# Patient Record
Sex: Male | Born: 1975
Health system: Southern US, Community
[De-identification: ages and names within clinical notes are randomized; demographics above are authoritative.]

## PROBLEM LIST (undated history)

## (undated) DIAGNOSIS — I10 Essential (primary) hypertension: Secondary | ICD-10-CM

## (undated) DIAGNOSIS — I428 Other cardiomyopathies: Secondary | ICD-10-CM

## (undated) DIAGNOSIS — I480 Paroxysmal atrial fibrillation: Secondary | ICD-10-CM

## (undated) HISTORY — DX: Other cardiomyopathies: I42.8

## (undated) HISTORY — PX: ANTERIOR CRUCIATE LIGAMENT REPAIR: SHX115

## (undated) HISTORY — DX: Paroxysmal atrial fibrillation: I48.0

## (undated) HISTORY — DX: Essential (primary) hypertension: I10

---

## 2000-01-11 ENCOUNTER — Encounter: Payer: Self-pay | Admitting: Orthopedic Surgery

## 2000-01-11 ENCOUNTER — Inpatient Hospital Stay (HOSPITAL_COMMUNITY): Admission: AD | Admit: 2000-01-11 | Discharge: 2000-01-13 | Payer: Self-pay | Admitting: Orthopedic Surgery

## 2003-02-26 ENCOUNTER — Encounter: Admission: RE | Admit: 2003-02-26 | Discharge: 2003-02-26 | Payer: Self-pay | Admitting: Sports Medicine

## 2003-03-20 ENCOUNTER — Ambulatory Visit (HOSPITAL_BASED_OUTPATIENT_CLINIC_OR_DEPARTMENT_OTHER): Admission: RE | Admit: 2003-03-20 | Discharge: 2003-03-20 | Payer: Self-pay | Admitting: Orthopedic Surgery

## 2003-06-02 ENCOUNTER — Emergency Department (HOSPITAL_COMMUNITY): Admission: EM | Admit: 2003-06-02 | Discharge: 2003-06-02 | Payer: Self-pay | Admitting: *Deleted

## 2006-10-03 ENCOUNTER — Inpatient Hospital Stay (HOSPITAL_COMMUNITY): Admission: EM | Admit: 2006-10-03 | Discharge: 2006-10-11 | Payer: Self-pay | Admitting: Emergency Medicine

## 2006-10-03 ENCOUNTER — Encounter: Payer: Self-pay | Admitting: Cardiology

## 2006-10-05 ENCOUNTER — Ambulatory Visit: Payer: Self-pay | Admitting: Cardiology

## 2006-10-05 HISTORY — PX: CARDIAC CATHETERIZATION: SHX172

## 2006-10-12 ENCOUNTER — Ambulatory Visit: Payer: Self-pay | Admitting: Cardiology

## 2006-10-19 ENCOUNTER — Ambulatory Visit: Payer: Self-pay | Admitting: Cardiology

## 2006-10-19 ENCOUNTER — Ambulatory Visit: Payer: Self-pay

## 2006-10-19 LAB — CONVERTED CEMR LAB
BUN: 10 mg/dL (ref 6–23)
Chloride: 106 meq/L (ref 96–112)
Creatinine, Ser: 0.8 mg/dL (ref 0.4–1.5)
GFR calc non Af Amer: 120 mL/min
Potassium: 4.6 meq/L (ref 3.5–5.1)

## 2006-10-26 ENCOUNTER — Ambulatory Visit: Payer: Self-pay | Admitting: Cardiology

## 2006-10-31 ENCOUNTER — Ambulatory Visit: Payer: Self-pay | Admitting: Cardiology

## 2006-11-07 ENCOUNTER — Ambulatory Visit: Payer: Self-pay | Admitting: Cardiology

## 2006-11-07 LAB — CONVERTED CEMR LAB
BUN: 11 mg/dL (ref 6–23)
CO2: 29 meq/L (ref 19–32)
Calcium: 8.9 mg/dL (ref 8.4–10.5)
GFR calc Af Amer: 169 mL/min
GFR calc non Af Amer: 140 mL/min

## 2006-11-21 ENCOUNTER — Ambulatory Visit: Payer: Self-pay | Admitting: Cardiology

## 2006-11-21 ENCOUNTER — Ambulatory Visit: Payer: Self-pay | Admitting: Internal Medicine

## 2006-12-19 ENCOUNTER — Ambulatory Visit: Payer: Self-pay | Admitting: Cardiology

## 2007-01-06 ENCOUNTER — Encounter: Payer: Self-pay | Admitting: Internal Medicine

## 2007-01-06 ENCOUNTER — Ambulatory Visit: Payer: Self-pay

## 2007-01-06 ENCOUNTER — Ambulatory Visit: Payer: Self-pay | Admitting: Internal Medicine

## 2007-01-27 ENCOUNTER — Ambulatory Visit: Payer: Self-pay | Admitting: Cardiology

## 2007-03-01 ENCOUNTER — Ambulatory Visit: Payer: Self-pay | Admitting: Cardiology

## 2007-04-04 ENCOUNTER — Ambulatory Visit: Payer: Self-pay | Admitting: Cardiology

## 2007-05-08 ENCOUNTER — Ambulatory Visit: Payer: Self-pay | Admitting: Internal Medicine

## 2007-08-30 ENCOUNTER — Ambulatory Visit: Payer: Self-pay | Admitting: Cardiovascular Disease

## 2007-09-13 ENCOUNTER — Ambulatory Visit: Payer: Self-pay | Admitting: Cardiovascular Disease

## 2007-09-28 ENCOUNTER — Ambulatory Visit: Payer: Self-pay | Admitting: Cardiology

## 2007-09-28 LAB — CONVERTED CEMR LAB
BUN: 8 mg/dL (ref 6–23)
Calcium: 8.9 mg/dL (ref 8.4–10.5)
Chloride: 101 meq/L (ref 96–112)
GFR calc Af Amer: 126 mL/min
GFR calc non Af Amer: 104 mL/min
Glucose, Bld: 92 mg/dL (ref 70–99)

## 2007-10-13 ENCOUNTER — Encounter: Admission: RE | Admit: 2007-10-13 | Discharge: 2007-10-13 | Payer: Self-pay | Admitting: Sports Medicine

## 2007-10-30 ENCOUNTER — Ambulatory Visit: Payer: Self-pay | Admitting: Cardiology

## 2007-10-30 ENCOUNTER — Encounter: Payer: Self-pay | Admitting: Cardiology

## 2007-10-30 ENCOUNTER — Ambulatory Visit: Payer: Self-pay

## 2007-11-20 ENCOUNTER — Encounter: Admission: RE | Admit: 2007-11-20 | Discharge: 2007-11-20 | Payer: Self-pay | Admitting: Orthopedic Surgery

## 2007-11-21 ENCOUNTER — Ambulatory Visit (HOSPITAL_BASED_OUTPATIENT_CLINIC_OR_DEPARTMENT_OTHER): Admission: RE | Admit: 2007-11-21 | Discharge: 2007-11-21 | Payer: Self-pay | Admitting: Orthopedic Surgery

## 2008-06-30 DIAGNOSIS — I10 Essential (primary) hypertension: Secondary | ICD-10-CM

## 2008-06-30 DIAGNOSIS — I480 Paroxysmal atrial fibrillation: Secondary | ICD-10-CM

## 2008-06-30 DIAGNOSIS — I428 Other cardiomyopathies: Secondary | ICD-10-CM

## 2010-10-16 ENCOUNTER — Encounter: Payer: Self-pay | Admitting: Physician Assistant

## 2010-10-16 ENCOUNTER — Ambulatory Visit (INDEPENDENT_AMBULATORY_CARE_PROVIDER_SITE_OTHER): Payer: 59 | Admitting: Physician Assistant

## 2010-10-16 DIAGNOSIS — I4891 Unspecified atrial fibrillation: Secondary | ICD-10-CM

## 2010-10-16 DIAGNOSIS — Z7289 Other problems related to lifestyle: Secondary | ICD-10-CM

## 2010-10-16 DIAGNOSIS — I428 Other cardiomyopathies: Secondary | ICD-10-CM

## 2010-10-16 DIAGNOSIS — I10 Essential (primary) hypertension: Secondary | ICD-10-CM

## 2010-10-16 LAB — CONVERTED CEMR LAB: TSH: 0.62 microintl units/mL

## 2010-10-16 NOTE — Assessment & Plan Note (Signed)
He needs further rate control.  I will adjust his Toprol to 100 mg in the morning and 50 mg in the evening.  He will return next week in close followup with me.  He can continue on Pradaxa for stroke prophylaxis.  If he remains in atrial fibrillation, we can consider pursuing cardioversion in about 4 weeks.  He had lab work drawn at his primary care provider's office today.  We will try to obtain these.  If he has not had TSH drawn, this will be arranged when he is seen back in followup next week.  He knows to contact us should he develop shortness of breath or weight gain.

## 2010-10-16 NOTE — Assessment & Plan Note (Signed)
Controlled.  

## 2010-10-16 NOTE — Assessment & Plan Note (Signed)
I have asked him to cut back on this.  I explained the risk of going back into atrial fibrillation with heavy alcohol use.  He seems to understand.

## 2010-10-16 NOTE — Progress Notes (Signed)
History of Present Illness: Primary Cardiologist:  Dr. Olga Millers  [name] is a 35 yo male with a h/o NICM with improved EF.  NICM thought to be 2/2 alcohol or tachy mediated.  He has a h/o AFib previously on coumadin.  Last echo in 2009 demonstrated EF 55-60%, mild to mod LAE, trivial MR.  Cath in 2008 demonstrated normal coronary arteries.  He has not been seen in our office since 2009.  He recently presented to his PCP with complaints of cold symptoms and a possible infection of his right leg.  It sounds as though he was treated for right lower extremity cellulitis.  He was noted to be back in atrial fibrillation.  Of note, the patient had not taken his metoprolol for a year or more.  It is unknown how fast his heart rate was.  At that time, his PCP placed him on Pradaxa 150 mg b.i.d.  He was also placed on Toprol-XL 100 mg daily.  He was asked to follow up today.  The patient denies any chest pain, shortness of breath, orthopnea, PND, edema.  He's been exercising and working without difficulty.  He denies syncope or near-syncope.  He denies any palpitations.  His right lower extremity pain and erythema has resolved.  Past Medical History  Diagnosis Date  . Nonischemic cardiomyopathy     EF improved; ? 2/2 ETOH vs. tachy mediated;  Echo 10/2007: EF 55-60%; mild to mod LAE,; trivial MR;    Cath 09/2006: normal cors  . Paroxysmal a-fib   . HTN (hypertension)     Current Medications: Current outpatient prescriptions:dabigatran (PRADAXA) 150 MG CAPS, Take 150 mg by mouth every 12 (twelve) hours.  , Disp: , Rfl: ;  metoprolol (TOPROL-XL) 100 MG 24 hr tablet, Take 100 mg by mouth daily.  , Disp: , Rfl:   No Known Allergies  History   Social History  . Marital Status: Married    Spouse Name: N/A    Number of Children: N/A  . Years of Education: N/A   Occupational History  . Not on file.   Social History Main Topics  . Smoking status: Never Smoker   . Smokeless tobacco: Not on file   . Alcohol Use: 7.2 oz/week    12 Cans of beer per week  . Drug Use: No  . Sexually Active:    Other Topics Concern  . Not on file   Social History Narrative  . No narrative on file   Review of Systems - See HPI.  Otherwise, all other systems reviewed and negative.  Vital Signs: BP 136/78  Pulse 123  Ht 6' (1.829 m)  Wt 280 lb 8 oz (127.234 kg)  BMI 38.04 kg/m2  PHYSICAL EXAM: Well nourished, well developed, in no acute distress HEENT: normal Neck: no JVD Endo: no thyromegaly. Cardiac:  normal S1, S2; irreg, irreg; no murmur Lungs:  clear to auscultation bilaterally, no wheezing, rhonchi or rales Abd: soft, nontender, no hepatomegaly Ext: no edema Skin: warm and dry Neuro:  CNs 2-12 intact, no focal abnormalities noted Vasc: no carotid bruits.  EKG: Atrial Fibrillation; normal axis; nonspecific ST-T wave changes; nonspecific interventricular conduction delay.  Heart rate 123.

## 2010-10-16 NOTE — Assessment & Plan Note (Signed)
His EF has normalized in the past.  We will arrange followup echocardiogram to reassess his LV function given his recurrent atrial fibrillation.

## 2010-10-20 NOTE — Assessment & Plan Note (Addendum)
Summary: Please see noted done in [facility] that is scanned in.  Medications Added PRADAXA 150 MG CAPS (DABIGATRAN ETEXILATE MESYLATE) Take 1 tablet by mouth twice a day TOPROL XL 100 MG XR24H-TAB (METOPROLOL SUCCINATE) Take one by mouth every morning; Take one-half tab. by mouth every evening      Allergies Added: NKDA  Visit Type:  Follow-up Primary Provider:  Dr. 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 I educated pt and explained that he does need to have his INR checked still on Pradaxa just not as often as when on coumadin.  History of Present Illness:  Please see noted done in [facility] that is scanned in.  Current Medications (verified): 1)  Pradaxa 150 Mg Caps (Dabigatran Etexilate Mesylate) .... Take 1 Tablet By Mouth Twice A Day 2)  Toprol Xl 100 Mg Xr24h-Tab (Metoprolol Succinate) .... Take 1 Tablet By Mouth Once A Day  Allergies (verified): No Known Drug Allergies  Vital Signs:  Patient profile:   35 year old male Height:      72 inches Weight:      280.50 pounds BMI:     38.18 Pulse rate:   123 / minute Pulse rhythm:   irregular Resp:     16 per minute BP sitting:   136 / 78  (left arm) Cuff size:   regular  Vitals Entered By: Celestia Khat, CMA (October 16, 2010 11:11 AM)   Impression & Recommendations:  Problem # 1:  ATRIAL FIBRILLATION (ICD-427.31)  His updated medication list for this problem includes:    Toprol Xl 100 Mg Xr24h-tab (Metoprolol succinate) .[name]... Take one by mouth every morning; take one-half tab. by mouth every evening  Orders: EKG w/ Interpretation (93000)  Patient Instructions: 1)  Your physician has recommended you make the following change in your medication: Increase Toprol to 100 mg take 1 tab by mouth in  the morning and 1/2 tab by mouth in the evening.  A [facility] [address] 2)  Your physician recommends that you schedule a follow-up appointment in: 10/23/10 10:00 TO SEE SCOTT WEAVER, PA Prescriptions: TOPROL XL 100 MG XR24H-TAB (METOPROLOL SUCCINATE) Take one by mouth every morning; Take one-half tab. by mouth every evening  #45 x 11   Entered and Authorized by:   Tereso Newcomer PA-C   Signed by:   Tereso Newcomer PA-C on 10/16/2010   Method used:   [facility]  [address] [id number]       [address]       [place], [place]  [address]       Ph: [phone] or [phone]       Fax: [phone]   RxID:   [id number]   Appended Document: Please see noted done in [facility] that is scanned in. Tried to call pt and set up day for echo to be done this week. 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 Danielle Rankin, CMA  Appended Document: Please see noted done in [facility]  that is scanned in. Tried to call pt again to schedule echo and after about 30 rings and voice message came on and said to put in remote access and then the phone disconnected. Cannot leave message @ work # either for pt. Danielle Rankin, CMA.Shane KitchenMarland KitchenMarland Frederick

## 2010-10-21 ENCOUNTER — Encounter: Payer: Self-pay | Admitting: Physician Assistant

## 2010-10-23 ENCOUNTER — Encounter: Payer: Self-pay | Admitting: Physician Assistant

## 2010-10-23 ENCOUNTER — Ambulatory Visit (INDEPENDENT_AMBULATORY_CARE_PROVIDER_SITE_OTHER): Payer: 59 | Admitting: Physician Assistant

## 2010-10-23 DIAGNOSIS — I4891 Unspecified atrial fibrillation: Secondary | ICD-10-CM

## 2010-10-23 NOTE — Progress Notes (Signed)
History of Present Illness: Primary Cardiologist: Dr. Olga Millers  [name] is a 35 yo male with a h/o AFib and NICM with improved EF. NICM thought to be 2/2 alcohol or tachy mediated. He has a h/o AFib previously on coumadin. Last echo in 2009 demonstrated EF 55-60%, mild to mod LAE, trivial MR. Cath in 2008 demonstrated normal coronary arteries.   I saw him last week with recurrent atrial fibrillation.  He was placed on Pradaxa by his primary care provider.  Hel was off of his Toprol.  This was restarted.  I adjusted his medication for rate control and brought him back today for followup.  He still needs an echocardiogram scheduled.  Of note, his TSH was normal.  He is doing well.  He has no complaints.  Denies palpitations, chest pain, shortness of breath.  He denies orthopnea, PND or edema.  He denies syncope.  He denies lightheadedness or dizziness or near-syncope.  He is tolerating the medications well.   Past Medical History  Diagnosis Date  . Nonischemic cardiomyopathy     EF improved; ? 2/2 ETOH vs. tachy mediated;  Echo 10/2007: EF 55-60%; mild to mod LAE,; trivial MR;    Cath 09/2006: normal cors  . Paroxysmal a-fib   . HTN (hypertension)     Current Medications: Current outpatient prescriptions:dabigatran (PRADAXA) 150 MG CAPS, Take 150 mg by mouth every 12 (twelve) hours.  , Disp: , Rfl: ;  metoprolol (TOPROL-XL) 100 MG 24 hr tablet, Take 100 mg by mouth. Take 1 tab in the morning and 1/2 tab in the evening, Disp: , Rfl:   No Known Allergies  Vital Signs: BP 118/62  Pulse 70  Ht 6' (1.829 m)  Wt 284 lb (128.822 kg)  BMI 38.52 kg/m2  PHYSICAL EXAM: Well nourished, well developed, in no acute distress HEENT: normal Neck: no JVD Cardiac:  normal S1, S2; Irregularly irregular; no murmur Lungs:  clear to auscultation bilaterally, no wheezing, rhonchi or rales Abd: soft, nontender, no hepatomegaly Ext: no edema Skin: warm and dry Neuro:  CNs 2-12 intact, no focal  abnormalities noted  EKG: Atrial fibrillation, heart rate 96, normal axis, no acute changes

## 2010-10-23 NOTE — Assessment & Plan Note (Signed)
Rate is much better controlled.  He will continue on Pradaxa.  I will increase his Toprol to 100 mg twice a day.  He will be set up for an echocardiogram to reassess his LV function.  I will bring him back in the next couple weeks with Dr. Jens Som to discuss possible cardioversion.  He knows to contact us if he has a change in symptoms before then.

## 2010-10-29 NOTE — Miscellaneous (Signed)
Summary: TSH normal in 09/2010  Clinical Lists Changes  Observations: Added new observation of TSH: 0.62 microintl units/mL (10/16/2010 9:00)

## 2010-10-29 NOTE — Assessment & Plan Note (Signed)
Summary: Please see noted done in [facility] Link that will be scanned i  Medications Added TOPROL XL 100 MG XR24H-TAB (METOPROLOL SUCCINATE) Take 1 tablet by mouth two times a day      Allergies Added: NKDA  Visit Type:  Follow-up Primary Provider:  Dr. Kevan Ny  CC:  no complaints.  History of Present Illness: Please see noted done in [facility] that will be scanned in.   Current Medications (verified): 1)  Pradaxa 150 Mg Caps (Dabigatran Etexilate Mesylate) .... Take 1 Tablet By Mouth Twice A Day 2)  Toprol Xl 100 Mg Xr24h-Tab (Metoprolol Succinate) .... Take One By Mouth Every Morning; Take One-Half Tab. By Mouth Every Evening  Allergies (verified): No Known Drug Allergies  Vital Signs:  Patient profile:   35 year old male Height:      72 inches Weight:      284 pounds BMI:     38.66 Pulse rate:   70 / minute BP sitting:   118 / 82  (left arm) Cuff size:   regular  Vitals Entered By: Hardin Negus, RMA (October 23, 2010 10:19 AM)   Other Orders: EKG w/ Interpretation (93000) Echocardiogram (Echo)  Patient Instructions: 1)  Your physician has recommended you make the following change in your medication:  2)  1.  Increase Toprol to 100 mg two times a day. 3)  2.  Keep taking Pradaxa.  Do not miss any. 4)  Your physician has requested that you have an echocardiogram.  Echocardiography is a painless test that uses sound waves to create images of your heart. It provides your doctor with information about the size and shape of your heart and how well your heart's chambers and valves are working.  This procedure takes approximately one hour. There are no restrictions for this procedure. 5)  Your physician recommends that you schedule a follow-up appointment in:  2 weeks with Dr. Jens Som or Lorin Picket on a day Dr. Jens Som is here. Prescriptions: TOPROL XL 100 MG XR24H-TAB (METOPROLOL SUCCINATE) Take 1 tablet by mouth two times a day  #60 x 5   Entered and Authorized by:    Tereso Newcomer PA-C   Signed by:   Tereso Newcomer PA-C on 10/23/2010   Method used:   [facility]  [address] [id number]       [address]       [place], [place]  [address]       Ph: [phone] or [phone]       Fax: [phone]   RxID:   [id number]

## 2010-11-03 NOTE — Letter (Signed)
Summary: [facility] Progress Note  [facility] Progress Note   Imported By: Earl Many 10/30/2010 16:50:35  _____________________________________________________________________  External Attachment:    Type:   Image     Comment:   External Document

## 2010-11-06 ENCOUNTER — Ambulatory Visit (INDEPENDENT_AMBULATORY_CARE_PROVIDER_SITE_OTHER): Payer: 59 | Admitting: Physician Assistant

## 2010-11-06 ENCOUNTER — Encounter (INDEPENDENT_AMBULATORY_CARE_PROVIDER_SITE_OTHER): Payer: Self-pay | Admitting: *Deleted

## 2010-11-06 ENCOUNTER — Ambulatory Visit (HOSPITAL_COMMUNITY): Payer: 59 | Attending: Cardiology

## 2010-11-06 ENCOUNTER — Other Ambulatory Visit: Payer: Self-pay | Admitting: Cardiology

## 2010-11-06 ENCOUNTER — Other Ambulatory Visit: Payer: Self-pay | Admitting: Physician Assistant

## 2010-11-06 ENCOUNTER — Encounter: Payer: Self-pay | Admitting: Physician Assistant

## 2010-11-06 DIAGNOSIS — I059 Rheumatic mitral valve disease, unspecified: Secondary | ICD-10-CM | POA: Insufficient documentation

## 2010-11-06 DIAGNOSIS — I10 Essential (primary) hypertension: Secondary | ICD-10-CM | POA: Insufficient documentation

## 2010-11-06 DIAGNOSIS — E669 Obesity, unspecified: Secondary | ICD-10-CM | POA: Insufficient documentation

## 2010-11-06 DIAGNOSIS — R609 Edema, unspecified: Secondary | ICD-10-CM

## 2010-11-06 DIAGNOSIS — I4891 Unspecified atrial fibrillation: Secondary | ICD-10-CM

## 2010-11-06 DIAGNOSIS — I428 Other cardiomyopathies: Secondary | ICD-10-CM

## 2010-11-06 DIAGNOSIS — Z6838 Body mass index (BMI) 38.0-38.9, adult: Secondary | ICD-10-CM | POA: Insufficient documentation

## 2010-11-06 LAB — CBC WITH DIFFERENTIAL/PLATELET
Basophils Absolute: 0 10*3/uL (ref 0.0–0.1)
Eosinophils Absolute: 0.3 10*3/uL (ref 0.0–0.7)
Eosinophils Relative: 3.1 % (ref 0.0–5.0)
MCV: 90.8 fl (ref 78.0–100.0)
Monocytes Absolute: 0.6 10*3/uL (ref 0.1–1.0)
Neutrophils Relative %: 69 % (ref 43.0–77.0)
Platelets: 144 10*3/uL — ABNORMAL LOW (ref 150.0–400.0)
RDW: 12.9 % (ref 11.5–14.6)
WBC: 9.4 10*3/uL (ref 4.5–10.5)

## 2010-11-06 LAB — PROTIME-INR
INR: 1.1 ratio — ABNORMAL HIGH (ref 0.8–1.0)
Prothrombin Time: 12.2 s (ref 10.2–12.4)

## 2010-11-06 LAB — BASIC METABOLIC PANEL
CO2: 26 mEq/L (ref 19–32)
Calcium: 8.4 mg/dL (ref 8.4–10.5)
Creatinine, Ser: 0.9 mg/dL (ref 0.4–1.5)

## 2010-11-06 NOTE — Assessment & Plan Note (Signed)
He has some edema and his JVP is elevated.  His echocardiogram is pending from today.  I will give him Lasix 20 mg a day for 3 days.  I will also give him potassium 10 mEq once a day for 3 days.  He can take this p.r.n. After this.  I will check a basic metabolic panel and a BNP today.  We'll also obtain a repeat basic metabolic panel prior to his cardioversion.

## 2010-11-06 NOTE — Assessment & Plan Note (Signed)
Echocardiogram pending to reassess his LV function while back in atrial fibrillation.

## 2010-11-06 NOTE — Progress Notes (Signed)
History of Present Illness: Primary Cardiologist: Dr. Olga Millers   [name] is a 35 yo male with a h/o AFib and NICM with improved EF. NICM thought to be 2/2 alcohol or tachy mediated. He has a h/o AFib previously on coumadin. Last echo in 2009 demonstrated EF 55-60%, mild to mod LAE, trivial MR. Cath in 2008 demonstrated normal coronary arteries.   I saw him on February 24th with recurrent atrial fibrillation.  He had been placed back on his Toprol.  I have been adjusting his Toprol for rate control.  He has been on Pradaxa.  He had an echocardiogram today.  He returns for followup.  He felt swollen earlier this week and took one Lasix with improvement.  Otherwise, he denies shortness of breath, chest pain, syncope.  He denies orthopnea or PND.  He has been diligent about taking his Pradaxa twice a day every day.  He has been on this medication for 4 weeks now.   Past Medical History  Diagnosis Date  . Nonischemic cardiomyopathy     EF improved; ? 2/2 ETOH vs. tachy mediated;  Echo 10/2007: EF 55-60%; mild to mod LAE,; trivial MR;    Cath 09/2006: normal cors  . Paroxysmal a-fib   . HTN (hypertension)     Current Outpatient Prescriptions  Medication Sig Dispense Refill  . dabigatran (PRADAXA) 150 MG CAPS Take 150 mg by mouth every 12 (twelve) hours.        . metoprolol (TOPROL-XL) 100 MG 24 hr tablet Take 100 mg by mouth 2 (two) times daily.         No Known Allergies  Vital Signs: BP 126/80  Pulse 98  Ht 6' (1.829 m)  Wt 284 lb (128.822 kg)  BMI 38.52 kg/m2  PHYSICAL EXAM: Well nourished, well developed, in no acute distress HEENT: normal Neck: Mildly elevated JVP at 45 Cardiac:  normal S1, S2;  Irregularly irregular rhythm Lungs:  clear to auscultation bilaterally, no wheezing, rhonchi or rales Abd: soft, nontender, no hepatomegaly Ext: Trace bilateral edema Skin: warm and dry Neuro:  CNs 2-12 intact, no focal abnormalities noted  EKG: Atrial fibrillation, heart  rate 98, normal axis, no ischemic changes, PVC

## 2010-11-06 NOTE — Assessment & Plan Note (Signed)
Rate controlled.  He has been on Pradaxa twice a day for 4 weeks total.  I discussed with Dr. Jens Som.  We will plan on elective cardioversion next week.

## 2010-11-09 ENCOUNTER — Telehealth (INDEPENDENT_AMBULATORY_CARE_PROVIDER_SITE_OTHER): Payer: Self-pay | Admitting: *Deleted

## 2010-11-10 ENCOUNTER — Telehealth: Payer: Self-pay | Admitting: Physician Assistant

## 2010-11-10 NOTE — Telephone Encounter (Signed)
Called pt on cell phone and explained for him not to take his metoprolol the morning of his DCCV, but to make sure he does still take his Pradaxa that this was important not to miss the dose of Pradaxa. Pt gave verbal understanding today. Danielle Rankin, CMA

## 2010-11-10 NOTE — Telephone Encounter (Signed)
Tried to call pt back today, but no answer and no machine to leave message on...states this is his 3rd time calling but I do not see other messages.Marland KitchenMarland KitchenArletha Pili, CMA
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
## 2010-11-10 NOTE — Letter (Signed)
Summary: Cardioversion/TEE Instructions  Architectural technologist, Main Office  [address]   [place], [place] [address]   Phone: [phone]  Fax: [phone]    Cardioversion / TEE Cardioversion Instructions  11/06/2010 MRN: [id number]  [name] [address] [place], [place]  [address]  [place]  Dear Mr. HILLIGOSS, You are scheduled for a Cardioversion on _3/22/12 @ 10 AM with Dr. Jens Som.  Please arrive at the [facility] at  8:30 a.m.  on the day of your procedure.  1)   DIET:  A)   Nothing to eat or drink after midnight except your medications with a sip of water.  B)   May have clear liquid breakfast, then nothing to eat or drink after 6 a.m.      Clear liquids include:  water, broth, Sprite, Ginger Ale, black coffee, tea (no sugar),      cranberry / grape / apple juice, jello (not red), popsicle from clear juices (not red).  2)   Come to the [place] office on _3/21/12 for lab work. The lab at [facility] is open from 8:30 a.m. to 1:30 p.m. and 2:30 p.m. to 4:00 p.m. The lab at [facility] is open from 7:30 a.m. to 5:30 p.m. You do not have to be fasting.  3)   MAKE SURE YOU TAKE YOUR PRADAXA.   B)   YOU MAY TAKE ALL of your remaining medications with a small amount of water.    4)  Must have a responsible person to drive you home.  5)   Bring a current list of your medications and current insurance cards.   * Special Note:  Every effort is made to have your procedure done on time. Occasionally there are emergencies that present themselves at the hospital that may cause delays. Please be patient if a delay does occur.  * If you have any questions after you get home, please call the office at [phone].

## 2010-11-11 ENCOUNTER — Ambulatory Visit (INDEPENDENT_AMBULATORY_CARE_PROVIDER_SITE_OTHER): Payer: 59 | Admitting: *Deleted

## 2010-11-11 DIAGNOSIS — I4891 Unspecified atrial fibrillation: Secondary | ICD-10-CM

## 2010-11-11 LAB — BASIC METABOLIC PANEL
BUN: 10 mg/dL (ref 6–23)
CO2: 28 mEq/L (ref 19–32)
Calcium: 9.1 mg/dL (ref 8.4–10.5)
Creatinine, Ser: 0.9 mg/dL (ref 0.4–1.5)
Glucose, Bld: 102 mg/dL — ABNORMAL HIGH (ref 70–99)

## 2010-11-11 LAB — CBC WITH DIFFERENTIAL/PLATELET
Eosinophils Absolute: 0 10*3/uL (ref 0.0–0.7)
Lymphs Abs: 1.5 10*3/uL (ref 0.7–4.0)
MCHC: 34.2 g/dL (ref 30.0–36.0)
MCV: 91.5 fl (ref 78.0–100.0)
Monocytes Absolute: 0.7 10*3/uL (ref 0.1–1.0)
Neutrophils Relative %: 82.2 % — ABNORMAL HIGH (ref 43.0–77.0)
Platelets: 204 10*3/uL (ref 150.0–400.0)
RDW: 13 % (ref 11.5–14.6)

## 2010-11-12 ENCOUNTER — Ambulatory Visit (HOSPITAL_COMMUNITY)
Admission: RE | Admit: 2010-11-12 | Discharge: 2010-11-12 | Disposition: A | Discharge status: Home / Self Care | Payer: 59 | Source: Ambulatory Visit | Attending: Cardiology | Admitting: Cardiology

## 2010-11-12 DIAGNOSIS — I4891 Unspecified atrial fibrillation: Secondary | ICD-10-CM

## 2010-11-12 DIAGNOSIS — Z01810 Encounter for preprocedural cardiovascular examination: Secondary | ICD-10-CM | POA: Insufficient documentation

## 2010-11-13 ENCOUNTER — Telehealth: Payer: Self-pay | Admitting: *Deleted

## 2010-11-13 NOTE — Telephone Encounter (Signed)
Pt rtn call-pls call  °

## 2010-11-13 NOTE — Telephone Encounter (Signed)
Message copied by Deliah Goody on Fri Nov 13, 2010  9:07 AM ------      Message from: Olga Millers      Created: Wed Nov 11, 2010  5:26 PM       Repeat cbc in 4 weeks

## 2010-11-13 NOTE — Telephone Encounter (Signed)
Spoke with pt, appt for follow up scheduled and labs will be done at that appt

## 2010-11-19 NOTE — Progress Notes (Signed)
   Phone Note Outgoing Call   Summary of Call: lmom on pt cell [phone] for ptcb, pt needs to hold toprol xl the morning of his DCCV 11/12/10 @ 10am...Marland KitchenMarland KitchenDanielle Rankin, CMA

## 2010-11-19 NOTE — Progress Notes (Signed)
Summary: pt rtn call   Phone Note Call from Patient   Caller: Patient [phone] Reason for Call: Talk to Nurse Summary of Call: pt rtn call Initial call taken by: Glynda Jaeger,  November 10, 2010 8:03 AM  Follow-up for Phone Call        pt aware of needing to hold metoprolol but still take his Pradaxa not to missed any doses of Pradaxa , gave verbal understanding. Danielle Rankin, CMA  November 10, 2010 1:15 PM

## 2010-12-14 ENCOUNTER — Encounter: Payer: Self-pay | Admitting: Cardiology

## 2010-12-14 ENCOUNTER — Encounter: Payer: Self-pay | Admitting: *Deleted

## 2010-12-15 ENCOUNTER — Ambulatory Visit (INDEPENDENT_AMBULATORY_CARE_PROVIDER_SITE_OTHER): Payer: 59 | Admitting: Cardiology

## 2010-12-15 ENCOUNTER — Encounter: Payer: Self-pay | Admitting: Cardiology

## 2010-12-15 DIAGNOSIS — I428 Other cardiomyopathies: Secondary | ICD-10-CM

## 2010-12-15 DIAGNOSIS — Z7289 Other problems related to lifestyle: Secondary | ICD-10-CM

## 2010-12-15 DIAGNOSIS — I10 Essential (primary) hypertension: Secondary | ICD-10-CM

## 2010-12-15 MED ORDER — LISINOPRIL 10 MG PO TABS: 10.0000 mg | ORAL_TABLET | Freq: Every day | ORAL | Status: DC

## 2010-12-15 NOTE — Patient Instructions (Signed)
Your physician recommends that you schedule a follow-up appointment in: 3 months with Dr. Jens Som  Your physician has requested that you have an echocardiogram. Echocardiography is a painless test that uses sound waves to create images of your heart. It provides your doctor with information about the size and shape of your heart and how well your heart's chambers and valves are working. This procedure takes approximately one hour. There are no restrictions for this procedure. In 3 months.  Your physician recommends that you return for lab work in 1 week.   Your physician has recommended you make the following change in your medication: START LISINOPRIL 10mg  daily.

## 2010-12-15 NOTE — Assessment & Plan Note (Signed)
Patient remains in sinus rhythm status post cardioversion. Continue pradaxa and toprol.

## 2010-12-15 NOTE — Progress Notes (Signed)
[name] is a 35 yo male with a h/o AFib and NICM with improved EF. NICM thought to be due to alcohol or tachy mediated. He has a h/o AFib previously on coumadin. Last echo in 2009 demonstrated EF 55-60%, mild to mod LAE, trivial MR. Cath in 2008 demonstrated normal coronary arteries. Patient seen again on February 24th with recurrent atrial fibrillation. He had been placed back on his Toprol and on Pradaxa. He had an echocardiogram in March of 2012  that showed severely reduced LV function, biatrial enlargement and reduced RV function. There was mild mitral regurgitation. He recently underwent cardioversion of his atrial fibrillation. Since then, the patient denies any dyspnea on exertion, orthopnea, PND, pedal edema, palpitations, syncope or chest pain.    Current Outpatient Prescriptions  Medication Sig Dispense Refill  . dabigatran (PRADAXA) 150 MG CAPS Take 150 mg by mouth every 12 (twelve) hours.        . furosemide (LASIX) 20 MG tablet 1 TAB once daily FOR 3 DAYS THEN TAKE  as needed.       . metoprolol (TOPROL-XL) 100 MG 24 hr tablet Take 100 mg by mouth 2 (two) times daily.       . potassium chloride (KLOR-CON) 10 MEQ CR tablet 1 TAB once daily FOR 3 DAYS THEN TAKE  as needed.          Past Medical History  Diagnosis Date  . Nonischemic cardiomyopathy     EF improved; ? 2/2 ETOH vs. tachy mediated;  Echo 10/2007: EF 55-60%; mild to mod LAE,; trivial MR;    Cath 09/2006: normal cors  . Paroxysmal a-fib   . HTN (hypertension)     Past Surgical History  Procedure Date  . Anterior cruciate ligament repair   . Cardiac catheterization 10/05/06    History   Social History  . Marital Status: Married    Spouse Name: N/A    Number of Children: N/A  . Years of Education: N/A   Occupational History  . Not on file.   Social History Main Topics  . Smoking status: Former Games developer  . Smokeless tobacco: Not on file   Comment: quit appox 12 yrs ago  . Alcohol Use: 7.2 oz/week   12 Cans of beer per week  . Drug Use: No  . Sexually Active: Not on file   Other Topics Concern  . Not on file   Social History Narrative  . No narrative on file    ROS: no fevers or chills, productive cough, hemoptysis, dysphasia, odynophagia, melena, hematochezia, dysuria, hematuria, rash, seizure activity, orthopnea, PND, pedal edema, claudication. Remaining systems are negative.  Physical Exam: Well-developed well-nourished in no acute distress.  Skin is warm and dry.  HEENT is normal.  Neck is supple. No thyromegaly.  Chest is clear to auscultation with normal expansion.  Cardiovascular exam is regular rate and rhythm.  Abdominal exam nontender or distended. No masses palpated. Extremities show no edema. neuro grossly intact  ECG sinus rhythm at a rate of 63. No ST changes.

## 2010-12-15 NOTE — Assessment & Plan Note (Signed)
Blood pressure elevated. Add lisinopril 10 mg daily. 

## 2010-12-15 NOTE — Assessment & Plan Note (Signed)
Patient counseled on discontinuing. 

## 2010-12-15 NOTE — Assessment & Plan Note (Signed)
Ejection fraction again decreased. This is either related to EtOH or atrial fibrillation with a rapid ventricular response. Continue beta blocker. Add lisinopril 10 mg p.o. Daily. Check potassium and renal function in one week. Plan repeat echocardiogram in 3 months to see if LV function has improved status post cardioversion and avoiding EtOH.

## 2010-12-25 ENCOUNTER — Other Ambulatory Visit (INDEPENDENT_AMBULATORY_CARE_PROVIDER_SITE_OTHER): Payer: 59 | Admitting: *Deleted

## 2010-12-25 DIAGNOSIS — I428 Other cardiomyopathies: Secondary | ICD-10-CM

## 2010-12-25 LAB — BASIC METABOLIC PANEL
BUN: 12 mg/dL (ref 6–23)
Calcium: 8.6 mg/dL (ref 8.4–10.5)
Creatinine, Ser: 0.8 mg/dL (ref 0.4–1.5)
GFR: 121.81 mL/min (ref >60.00)

## 2011-01-05 NOTE — Op Note (Signed)
NAMEORACIO, GALEN                ACCOUNT NO.:  [id number]   MEDICAL RECORD NO.:  [id number]          PATIENT TYPE:  AMB   LOCATION:  DSC                          FACILITY:  [facility]   PHYSICIAN:  Leonides Grills, M.D.     DATE OF BIRTH:  04-16-1976   DATE OF PROCEDURE:  11/21/2007  DATE OF DISCHARGE:                               OPERATIVE REPORT   PREOPERATIVE DIAGNOSES:  1. Right anterior distal tibial spurs.  2. Right anterior ankle loose body.  3. Right medial talar dome osteochondral lesion.  4. Right anterior ankle impingement.   POSTOPERATIVE DIAGNOSES:  1. Right anterior distal tibial spurs.  2. Right anterior ankle loose body.  3. Right medial talar dome osteochondral lesion.  4. Right anterior ankle impingement.   OPERATIONS:  1. Right ankle arthroscopy with limited debridement.  2. Right ankle arthrotomy with excision anterior distal tibial spurs.  3. Right ankle arthrotomy with excision anterior ankle loose body.  4. Debridement and drilling right medial talar dome osteochondral      lesion.   ANESTHESIA:  General with popliteal block.   SURGEON:  Leonides Grills, M.D.   ASSISTANT:  Rexene Edison, PA-C   ESTIMATED BLOOD LOSS:  Minimal.   TOURNIQUET TIME:  Approximately half hour.   COMPLICATIONS:  None.   DISPOSITION:  Stable to PAR.   INDICATIONS:  This is a 35 year old male who has had longstanding  anterior ankle pain that was interfering with his life.  He was  consented for the above procedure.  All risks which included infection,  nerve or vessel injury, persistent pain, worsening pain, prolonged  recovery, stiffness, arthritis were all explained, questions were  encouraged and answered.   The patient was taken to the operating room and placed in supine  position.  After adequate general endotracheal anesthesia was  administered as well as Ancef 1 g IV piggyback, the patient was then  placed in a sloppy lateral position with the operative side up on a  beanbag.  All bony prominence were well padded.  The right lower  extremity was then prepped and draped in a sterile manner over a  proximally placed thigh tourniquet.  We started the procedure mapping  out the anatomical landmarks to include anterior tibialis tendon,  peroneus tertius tendon.  The superficial peroneal nerve could not be  seen.  Spinal needle was then placed into the ankle and 20 mL of normal  saline was instilled in the ankle.  Weston Brass and spread technique was then  used to create the anteromedial portal.  Blunt tip trocar with cannula  followed by camera was placed in the ankle.  We tried to attempt to do  under direct visualization but due to the large osteophyte that was in  the way we were unable to visualize anterolaterally initially.  We then  just lateral to the peroneus tertius tendon made an anterolateral portal  with the nick and spread technique.  We then found the loose body was  quite large and there was a large amount of synovitis around it.  We  then extended the incision  and performed an ankle arthrotomy  anterolaterally.  Once this was done, we then removed the loose body  with a grabber.  This had to be actually be snapped due to the large  piece. Once this was removed we were then able to do a debridement of  the entire anterior aspect of the ankle which had a large amount of  synovitis.  We also cleaned around the anterior tibial osteophyte but  again we were going to remove this open.  We then saw that he had medial  talar dome osteochondral lesion that was anteriorly.  This was debrided  with a shaver as well as a House curette down to bone and normal  cartilage.  Multiple drill holes were placed using a microfracture  technique.  There was also a loose body in the deep deltoid ligament  region in the sub medial malleolar area.  This was also removed as well.  Once this was done, pictures were obtained throughout the procedure.  We  then removed the  scope and then made an ankle arthrotomy both  anteromedially and anterolaterally.  We then palpated out with a Glorious Peach  the entire anterior aspect of the distal tibia.  Then with a curved 1/4  inch osteotome this was then removed.  Once this was done, this was  palpated completely across the tibia to be adequately decompressed and  with dorsiflexion there was no impingement anteriorly.  The area was  copiously irrigated with normal saline.  There were no palpable loose  bodies in this area.  Again, we had the tourniquet up briefly during the  procedure, but due to the venous tourniquet we had to discontinue this.  The wound was closed with 4-0 nylon stitch.  Sterile dressing was  applied.  A modified Jones dressing was applied.  The patient was stable  to PAR.      Leonides Grills, M.D.  Electronically Signed     PB/MEDQ  D:  11/21/2007  T:  11/21/2007  Job:  [id number]

## 2011-01-05 NOTE — Assessment & Plan Note (Signed)
[facility]                            CARDIOLOGY OFFICE NOTE   MELBERT, BOTELHO                       MRN:          [id number]  DATE:03/01/2007                            DOB:          10-Mar-1976    Mr. Barrick is a pleasant gentleman who has a history of nonischemic  cardiomyopathy.  We did repeat his echocardiogram recently on Jan 06, 2007.  His LV function was normal with an estimated ejection fraction of  55% to 60%.  There was mild mitral regurgitation, tricuspid  regurgitation, and pulmonic insufficiency.  Since I last saw him, there  is no exertional chest pain, dyspnea, pedal edema, palpitations, or  syncope.   His medications include:  1. Coumadin as directed.  2. Digoxin 0.25 mg p.o. daily.  3. Toprol 100 mg p.o. daily.  4. Altace 5 mg p.o. b.i.d.   PHYSICAL EXAMINATION:  Shows a blood pressure of 143/87, and his pulse  is 61.  He weighs 273 pounds.  NECK:  Supple.  CHEST:  Clear.  CARDIOVASCULAR EXAM:  Reveals a regular rate and rhythm.  ABDOMINAL EXAM:  Benign.  EXTREMITIES:  Show no edema.  ELECTROCARDIOGRAM:  Shows a sinus rhythm at a rate of 59.  There are no  significant ST changes.   DIAGNOSES:  1. Nonischemic cardiomyopathy:  His left ventricular is now improved      on his recent echocardiogram.  His previous cardiomyopathy was most      likely either related to alcohol abuse or atrial fibrillation with      a rapid ventricular response (tachycardia mediated cardiomyopathy).      We will continue with his digoxin, Toprol, Altace, and Coumadin.  2. Paroxysmal atrial fibrillation:  He remains in sinus rhythm.  He      will continue on his digoxin and Toprol for rate control if his      atrial fibrillation recurs, as well as Coumadin with a goal INR of      2 to 3.  This is being monitored in our Coumadin Clinic.  3. Coumadin therapy:  As per number 2.  4. Alcohol abuse:  Now is off.  5. Hypertension:  His blood pressure  is borderline.  We will track      this and adjust as indicated.   We will see him back in 6 months.     Madolyn Frieze Jens Som, MD, [facility]  Electronically Signed   BSC/MedQ  DD: 03/01/2007  DT: 03/01/2007  Job #: [id number]

## 2011-01-05 NOTE — Assessment & Plan Note (Signed)
[facility]                            CARDIOLOGY OFFICE NOTE   AMORE, GRATER                       MRN:          [id number]  DATE:09/28/2007                            DOB:          09/24/1975    HISTORY OF PRESENT ILLNESS:  Mr. Villalva a very pleasant gentleman who has  a history of nonischemic cardiomyopathy.  A follow-up echocardiogram.  However, following cardioversion revealed that his LV function had  improved.  Most recent echo was on Jan 06, 2007, it showed normal LV  function.  We have felt previously that his cardiomyopathy was most  likely related either alcohol or may have been tachycardia mediated  related to his atrial fibrillation.  Since I saw him previously, he has  not had one drink.  He denies any dyspnea, chest pain, palpitations or  syncope.  There is no pedal edema.   MEDICATIONS:  1. Coumadin as directed.  2. Digoxin 0.25 mg p.o. daily.  3. Toprol 100 mg daily.  4. Altace 5 mg p.o. b.i.d.   PHYSICAL EXAMINATION:  VITAL SIGNS:  Shows a blood pressure of 122/80,  pulse is 60, weight 291 pounds.  HEENT:  Normal.  NECK:  Supple.  CHEST:  Clear.  CARDIOVASCULAR:  Regular rate.  ABDOMEN:  Exam shows no tenderness.  EXTREMITIES:  Show no edema.   STUDIES:  Electrocardiogram shows a sinus rhythm at a rate of 60.  There  are no ST changes noted.   DIAGNOSES:  1. Nonischemic cardiomyopathy - we will plan to repeat his      echocardiogram.  Note his most recent one back in May showed that      his LV function had improved to normal.  He will continue on his      digoxin, Toprol and Altace.  2. Paroxysmal atrial fibrillation - he remains in sinus rhythm.  He      will continue on his digoxin and Toprol.  If his LV function has      continued to be normal on his follow-up echocardiogram, then we      will plan to discontinue his Coumadin as his [place] score is 0.  If      his LV function is reduced then he will stay on  Coumadin.  If we do      indeed stop this, then he will begin aspirin 325 mg p.o. daily.  3. History of EtOH abuse - he has now not had a drink in over one      year.  4. Hypertension - his blood pressure is out of control.  I will check      a B-met given his ACE inhibitor use.  I will see him back in 12      months.     Madolyn Frieze Jens Som, MD, [facility]  Electronically Signed    BSC/MedQ  DD: 09/28/2007  DT: 09/29/2007  Job #: [id number]

## 2011-01-08 NOTE — Discharge Summary (Signed)
NAMEJYREN, CERASOLI                ACCOUNT NO.:  [id number]   MEDICAL RECORD NO.:  [id number]          PATIENT TYPE:  INP   LOCATION:  2025                         FACILITY:  [facility]   PHYSICIAN:  Pricilla Riffle, MD, FACCDATE OF BIRTH:  01-10-1976   DATE OF ADMISSION:  10/05/2006  DATE OF DISCHARGE:  10/05/2006                               DISCHARGE SUMMARY   IDENTIFICATION:  Patient is a gentleman with atrial fibrillation, LV  dysfunction, underwent cardioversion.   Patient underwent TEE prior to cardioversion to exclude thrombus.  This  was negative for thrombus.   He was anesthetized through anesthesia with 250 mg pentothal IV.  With  the pads in the AP position, he was cardioverted with 150 joules  synchronized biphasic energy to sinus rhythm.  Procedure was without  complication, and 12-lead EKG was pending.      Pricilla Riffle, MD, [facility]  Electronically Signed     PVR/MEDQ  D:  10/06/2006  T:  10/07/2006  Job:  [id number]

## 2011-01-08 NOTE — Assessment & Plan Note (Signed)
[facility]                            CARDIOLOGY OFFICE NOTE   TAVARIUS, GREWE                       MRN:          [id number]  DATE:10/31/2006                            DOB:          1976/07/31    Mr. Day is a pleasant 35 year old male that I recently admitted to  [facility] with atrial fibrillation with a rapid ventricular  response and congestive heart failure.  He had an echocardiogram that  showed an ejection fraction of 25%.  He underwent cardiac  catheterization that showed no significant coronary disease.  He  subsequently underwent a TEE-guided cardioversion.  Note it was felt  that his cardiomyopathy was either related to alcohol or to tachycardia-  mediated cardiomyopathy.  He was treated with medications and has done  well.  Since discharge, he denies any dyspnea, chest pain, palpitations,  or syncope.  There is no pedal edema.   MEDICATIONS:  1. Coumadin as directed.  2. Altace 2.5 mg p.o. b.i.d.  3. Digoxin 0.25 mg p.o. daily.  4. Toprol 100 mg p.o. daily.  5. Lasix 20 mg p.o. daily.   PHYSICAL EXAMINATION:  Blood pressure of 126/78 and his pulse is 54.  He  weighs 260 pounds.  NECK:  Supple.  CHEST:  Clear  CARDIOVASCULAR:  Reveals a regular rate and rhythm.  His right groin  shows no hematoma and no bruits.  EXTREMITIES:  Show no edema.   His electrocardiogram shows a sinus bradycardia at a rate of 54.  There  are occasional PVCs.  There are nonspecific T wave changes.   DIAGNOSES:  1. Nonischemic cardiomyopathy.  2. Paroxysmal atrial fibrillation.  3. Coumadin therapy.  4. Alcohol abuse, now resolved.   PLAN:  Mr. Barreiro is doing extremely well from a cardiovascular  standpoint.  He remains in a sinus rhythm.  He will continue on Coumadin  with a goal INR of 2-3.  He will be followed in our Coumadin clinic.  Note, his TSH and ferritin were normal at his previous and his  coronaries also showed no  obstructive disease.  I think his  cardiomyopathy is most likely ETOH-mediated.  However, it could  certainly also be tachycardia-mediated related to his atrial  fibrillation.  We will increase his Altace today to 5 mg p.o. b.i.d. and  we will check a B-MET in one week to follow his potassium and renal  function.  It is unlikely that we will be able to increase his Toprol as  his heart rate is 54.  I will see him back in 4 weeks and we will do  this if his heart rate will allow.  Otherwise, we will plan an  echocardiogram in 3 months to reassess his LV function.  If his EF is  less than or equal to 30%, then he will need an ICD.  He has  discontinued his alcohol use and we again discussed the importance of  this.     Madolyn Frieze Jens Som, MD, [facility]  Electronically Signed    BSC/MedQ  DD: 10/31/2006  DT: 10/31/2006  Job #:  [id number] 

## 2011-01-08 NOTE — Cardiovascular Report (Signed)
[name]                ACCOUNT NO.:  [id number]   MEDICAL RECORD NO.:  [id number]          PATIENT TYPE:  INP   LOCATION:  2025                         FACILITY:  [facility]   PHYSICIAN:  [name], MDDATE OF BIRTH:  March 09, 1976   DATE OF PROCEDURE:  10/05/2006  DATE OF DISCHARGE:                            CARDIAC CATHETERIZATION   PATIENT IDENTIFICATION:  [name] is a 35 year old male with a strong  history of alcohol abuse.  He was admitted with congestive heart  failure, was found to be in atrial fibrillation with rapid ventricular  response.  A 2D echocardiogram revealed an EF of 25%.  He is brought to  the Catheterization Lab for diagnostic right and left heart  catheterization.   PROCEDURES PERFORMED:  1. Right heart catheterization.  2. Left heart catheterization.  3. Left ventriculogram.  4. Selective coronary angiography.  5. Angio-Seal femoral closure device.   DESCRIPTION OF PROCEDURE:  The risks and benefits of catheterization  were explained, consent was signed and placed on the chart.  A 6 French  arterial sheath was placed in the right femoral artery using a modified  Seldinger technique.  Standard catheters including a JL4, JR4, and  angled pigtail used for procedure.  All catheter exchanges made over a  wire.  There were no apparent complications.  A 7 French venous sheath  was placed in the right femoral vein using a modified Seldinger  technique, a standard Swan-Ganz catheter was used.   HEMODYNAMICS:  Right atrial pressure mean of 12, RV pressure 32/4, PA  pressure 35/20 with a mean of 26.  Pulmonary capillary wedge pressure  mean of 17 with V waves to 25.   LV pressure 97/6 with an EDP of 21, central aortic pressure 85/57 with a  mean of 70, there was no aortic stenosis on pullback across the aortic  valve.  FICK cardiac output was 6.1L per minute, cardiac index was  2.5L/min per m2.  Pulmonary vascular resistance (PVR) equals 1.5  Wood's  units.   Left knee was short, angiographically normal.   LAD was a long vessel wrapping the apex.  It gave off a large proximal  diagonal that was angiographically normal.   Left circumflex was a small vessel with a small OM1, there was a very  large branching ramus vessel, it was angiographically normal.   Right coronary artery was a dominant vessel.  It gave off an RV branch,  a moderate sized PDA and two posterolaterals.  It was angiographically  normal.   Left ventriculogram done in the RAO projection showed a dilated  ventricle and EF of 25%.  There was no obvious mitral regurgitation.   ASSESSMENT:  1. Normal coronary arteries.  2. Severe left ventricular dysfunction as described above.  3. Mildly increased filling pressures.   PLAN/DISCUSSION:  I suspect his cardiomyopathy is due to a combination  of alcohol abuse and atrial fibrillation with poorly controlled  ventricular response.  He will proceed with a TEE-guided cardioversion  in the morning as per Dr. Antoine Poche.  He will need aggressive therapy  with ACE inhibitors  and beta-blockers as his blood pressure tolerates.  We have counseled him extensively on the need to avoid alcohol.      Shane Buckles. Bensimhon, MD  Electronically Signed     DRB/MEDQ  D:  10/05/2006  T:  10/06/2006  Job:  [id number]

## 2011-01-08 NOTE — Assessment & Plan Note (Signed)
[facility]                            CARDIOLOGY OFFICE NOTE   GUNNISON, CHAHAL                       MRN:          [id number]  DATE:11/21/2006                            DOB:          June 15, 1976    [name] returns for followup today.  Please refer to my previous notes  for details.  He has a nonischemic cardiomyopathy and history of atrial  fibrillation.  Since I last saw him, he denies any dyspnea on exertion,  orthopnea, PND, pedal edema, palpitations, presyncope, syncope, or chest  pain.  He is exercising now, riding a stationary bicycle and lifting  weights (light weights).   MEDICATIONS:  1. Coumadin, as directed.  2. Digoxin 0.25 mg p.o. daily.  3. Toprol-XL 100 mg p.o. daily.  4. Lasix 20 mg p.o. daily.  5. Altace 5 mg p.o. b.i.d.   PHYSICAL EXAMINATION:  VITAL SIGNS:  Blood pressure 128/72, pulse 60.  He weighs 267 pounds.  NECK:  Supple.  CHEST:  Clear.  CARDIOVASCULAR:  Regular rate and rhythm.  EXTREMITIES:  No edema.   DIAGNOSES:  1. Nonischemic cardiomyopathy.  2. Paroxysmal atrial fibrillation, holding sinus rhythm.  3. Coumadin therapy.  4. Alcohol abuse, now resolved.   PLAN:  [name] is doing extremely well from a symptomatic standpoint.  I have asked him to discontinue his Lasix.  He will continue to monitor  his weights at home, and if he increases his weights by 2 to 3 pounds,  begins to have increased edema or shortness of breath, then he will  resume his Lasix.  We are unable to increase his Toprol-XL today, as his  heart rate is 60.  We will plan to repeat his echocardiogram in mid-May.  If his LV function has improved, then we will continue with present  medications.  If his ejection fraction remains at 25%, then he will most  likely need an EP consult and defibrillator.  I will see him back in  approximately three months.     Madolyn Frieze Shane Som, MD, [facility]  Electronically Signed    BSC/MedQ  DD:  11/21/2006  DT: 11/21/2006  Job #: [id number]
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
## 2011-01-08 NOTE — H&P (Signed)
[name], [name]                ACCOUNT NO.:  [id number]   MEDICAL RECORD NO.:  [id number]          PATIENT TYPE:  INP   LOCATION:  1421                         FACILITY[facility]   PHYSICIAN:  Lonia Blood, M.D.       DATE OF BIRTH:  06-19-1976   DATE OF ADMISSION:  10/03/2006  DATE OF DISCHARGE:                              HISTORY & PHYSICAL   PRIMARY CARE PHYSICIAN:  No one.   CHIEF COMPLAINT:  Dyspnea.   HISTORY OF PRESENT ILLNESS:  [name] is a 35 year old gentleman  without any significant past medical history who presents to [facility]  Emergency Room after about a week of worsening shortness of breath.  The  patient relates episodes of paroxysmal nocturnal dyspnea as well as  episodes of dyspnea on exertion.  In both instances, the patient's  shortness of breath is getting better once he is a resting.  Also at  times, the patient reports some coughing when lying flat as well as some  occasional chest pain in the ribs when he coughs.  The patient denies  any palpitations.  He does not have any significant cardiac or pulmonary  history.   PAST MEDICAL HISTORY:  Knee surgery in the ACL.   FAMILY HISTORY:  Positive for hypertension.   MEDICATIONS:  None.   ALLERGIES:  None.   SOCIAL HISTORY:  The patient works in Building control surveyor.  He drinks  a pint of liquor every day, former tobacco user, quit more than 10 years  ago.   REVIEW OF SYSTEMS:  Positive for occasional headaches.  Negative for  stomach problems.  Negative for focal weakness or numbness.   PHYSICAL EXAMINATION UPON ADMISSION:  VITAL SIGNS:  Temperature 97  degrees, pulse 75, respirations 25, saturation 98% on room air, blood  pressure 140/94.  GENERAL APPEARANCE:  A well-developed, well-nourished Caucasian  gentleman in no acute distress lying on the stretcher, calm,  cooperative, alert, oriented to place, person, and time.  HEENT:  Eyes with pupils equal, round, react to light accommodation.  Extraocular movements intact.  The conjunctivae are pink.  Sclerae are  anicteric; throat is clear.  NECK:  Supple.  No JVD.  No carotid bruits.  CHEST:  Clear to auscultation bilaterally without wheezes, rhonchi or  crackles.  HEART:  Irregularly irregular, without murmurs, rubs or gallop.  ABDOMEN:  Soft, nontender, nondistended.  Bowel sounds are present.  LOWER EXTREMITIES:  +1 bilateral edema.  SKIN:  Warm and dry without any suspicious rashes.  NEUROLOGICAL EXAM:  Without any focal appreciable weakness.   LABORATORY VALUES ON ADMISSION:  Basic metabolic panel:  Sodium 136,  potassium 4.3, chloride 104, glucose 103, BUN 13, creatinine 0.8.  D-  dimer is 0.71.  A CBC shows a white blood cell count of 9.4, hemoglobin  14.3.  Liver profile:  Total bilirubin is 1.3, otherwise within normal  limits.  TSH shows 1.014, troponin I is 0.02.  CK is 104, CK-MB 2.1.  BNP is 185.  A chest x-ray shows cardiomegaly and interstitial  thickenings suggestive of interstitial pulmonary edema likely  superimposed on chronic changes.  EKG shows atrial fibrillation without  any significant ST-T changes.   ASSESSMENT/PLAN:  New onset atrial fibrillation with rapid ventricular  response.  The most important thing we need to establish here is if the  patients arrhythmia is secondary to cardiomyopathy or is secondary to  something else like alcohol abuse.  The patient will be admitted to the  acute care units of [facility].  He will be placed on  telemetry.  Cardiac enzymes will be obtained.  A transthoracic  echocardiogram will be ordered emergently.  For now, I will place the  patient on Cardizem drip and heparin drip and obtain a cardiologic  consultation.      Lonia Blood, M.D.  Electronically Signed     SL/MEDQ  D:  10/03/2006  T:  10/04/2006  Job:  [id number]
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
## 2011-01-08 NOTE — Discharge Summary (Signed)
NAMEOLAF, MESA                ACCOUNT NO.:  [id number]   MEDICAL RECORD NO.:  [id number]          PATIENT TYPE:  INP   LOCATION:  2025                         FACILITY:  [facility]   PHYSICIAN:  Madolyn Frieze. Jens Som, MD, FACCDATE OF BIRTH:  Jan 07, 1976   DATE OF ADMISSION:  10/05/2006  DATE OF DISCHARGE:  10/11/2006                               DISCHARGE SUMMARY   PROCEDURES:  Cardiac catheterization.  1. Right heart catheterization.  2. Left ventriculogram.  3. Selective coronary angiography.  4. Angio-Seal femoral closure device.  5. Transesophageal echocardiogram.  6. Direct current cardioversion.  7. 2-D echocardiogram.   PRIMARY DIAGNOSIS:  Nonischemic cardiomyopathy.   SECONDARY DIAGNOSES:  1. Acute systolic congestive heart failure.  2. Status post anterior cruciate ligament knee surgery.  3. Remote history of tobacco use.  4. History of alcohol use prior to admission.  5. Atrial fibrillation with rapid ventricular response.   Time of discharge, 47 minutes.   HOSPITAL COURSE:  Mr. Chalk is a 35 year old male with no previous  history of coronary artery disease.  He had about a weeks of worsening  shortness of breath, paroxysmal nocturnal dyspnea and increased dyspnea  on exertion.  He came to the emergency room, where he was found to be in  atrial fibrillation with rapid ventricular response, and a chest x-ray  showed pulmonary edema.  He was admitted for further evaluation and  treatment.   He was initially admitted by internal medicine but, his problems were  primarily cardiac, so a cardiology consult was called, and he was  transferred to the cardiology service.   His EF by 2-D echocardiogram was 25%.  Right and left heart  catheterization was performed to define his anatomy.   He had PA pressures of 35/20 with a mean of 26, and he had no coronary  artery disease.  His EF was 25% by cath with left ventricular dilatation  and no mitral regurg.  A TEE  cardioversion was scheduled.   The TEE showed no thrombus in the left atrial appendage.  His left  ventricular right ventricular systolic function were severely depressed.  There was no PFO.  He had direct current cardioversion with 150 joules  synchronized biphasic energy and was converted to sinus rhythm.  The  procedure was without complication.   Mr. Keena was strongly encouraged to discontinue alcohol.  He does have  a family history of alcohol abuse in his father, mother and sister, as  well as his grandfather.  Most of them have quit.  He was initially  started on folic acid and thiamine, but his nutritional status is felt  to be good, and he will not be on these medications at discharge.  Additionally, he does not feel that he needs a proton pump inhibitor at  discharge, although this was given to him during hospital stay.   Because of the atrial fibrillation, he was in the cardioversion.  He was  started on Heparin and transitioned to Coumadin.  He was given  Coumadin  at 10 and 12-1/2 mg.  He is to receive 7.5 mg on  the 18th, and his INR  on the 18th was 1.9.  He is expected to be therapeutic on September 10, 2006.   Mr. Aman was diuresed with IV Lasix, and during the course of his  hospital stay, his weight decreased from 280 to 264 pounds.  He is to  continue daily weights at home and call for 5-pound gain.  Mr. Benish  currently has oxygen saturation levels of 98% on room air.   Pending a therapeutic Coumadin level, Mr. Pesqueira is tentatively  considered stable for discharge on September 10, 2006, with outpatient  follow-up arranged.   DISCHARGE INSTRUCTIONS:  His activity level is to be increased  gradually.  He is to stick to a 2000-mg sodium heart-healthy diet.  He  is to increase his activities slowly and call for any problems with his  cath site.  He has a Coumadin clinic appointment on February 20th at  9:30, and is to follow up with Dr. Jens Som on March 10 at 11:45.   He  will need a note for work.   DISCHARGE MEDICATIONS:  1. Coumadin 5 mg, 1-1/2 tablet daily or as directed.  2. Altace 2.5 mg b.i.d.  3. Digoxin 0.25 mg daily.  4. Toprol XL 100 mg daily.  5. Furosemide 20 mg q. day.      Theodore Demark, PA-C      Madolyn Frieze. Jens Som, MD, [facility]  Electronically Signed    RB/MEDQ  D:  10/10/2006  T:  10/10/2006  Job:  [id number]   cc:   Candyce Churn, M.D.

## 2011-02-19 ENCOUNTER — Ambulatory Visit (HOSPITAL_COMMUNITY): Payer: 59 | Attending: Cardiology | Admitting: Radiology

## 2011-02-19 DIAGNOSIS — I079 Rheumatic tricuspid valve disease, unspecified: Secondary | ICD-10-CM | POA: Insufficient documentation

## 2011-02-19 DIAGNOSIS — I10 Essential (primary) hypertension: Secondary | ICD-10-CM | POA: Insufficient documentation

## 2011-02-19 DIAGNOSIS — I428 Other cardiomyopathies: Secondary | ICD-10-CM | POA: Insufficient documentation

## 2011-02-19 DIAGNOSIS — I059 Rheumatic mitral valve disease, unspecified: Secondary | ICD-10-CM | POA: Insufficient documentation

## 2011-02-26 ENCOUNTER — Ambulatory Visit (INDEPENDENT_AMBULATORY_CARE_PROVIDER_SITE_OTHER): Payer: 59 | Admitting: Cardiology

## 2011-02-26 ENCOUNTER — Encounter: Payer: Self-pay | Admitting: Cardiology

## 2011-02-26 ENCOUNTER — Other Ambulatory Visit (HOSPITAL_COMMUNITY): Payer: 59 | Admitting: Radiology

## 2011-02-26 DIAGNOSIS — I10 Essential (primary) hypertension: Secondary | ICD-10-CM

## 2011-02-26 DIAGNOSIS — I428 Other cardiomyopathies: Secondary | ICD-10-CM

## 2011-02-26 DIAGNOSIS — Z7289 Other problems related to lifestyle: Secondary | ICD-10-CM

## 2011-02-26 DIAGNOSIS — I4891 Unspecified atrial fibrillation: Secondary | ICD-10-CM

## 2011-02-26 MED ORDER — LISINOPRIL 10 MG PO TABS: 10.0000 mg | ORAL_TABLET | Freq: Every day | ORAL | Status: DC

## 2011-02-26 MED ORDER — METOPROLOL SUCCINATE ER 100 MG PO TB24: 100.0000 mg | ORAL_TABLET | Freq: Two times a day (BID) | ORAL | Status: DC

## 2011-02-26 NOTE — Assessment & Plan Note (Signed)
Blood pressure controlled. Continue present medications. 

## 2011-02-26 NOTE — Patient Instructions (Signed)
Your physician wants you to follow-up in:  6 months. You will receive a reminder letter in the mail two months in advance. If you don't receive a letter, please call our office to schedule the follow-up appointment.   

## 2011-02-26 NOTE — Assessment & Plan Note (Signed)
Patient remains in sinus rhythm. Continue beta blocker. He discontinued his pradaxa on his own. He states it made him feel bad. We discussed Coumadin today but he is not sure he can return for INR checks. He therefore prefers aspirin. I explained that the risk of CVA is higher with aspirin and he understands.

## 2011-02-26 NOTE — Assessment & Plan Note (Addendum)
LV function with some improvement on recent echo. However it has not normalized at this point. Continue beta blocker and ACE inhibitor. I think his cardiomyopathy is related to alcohol or tachycardia mediated from atrial fibrillation. We will plan to repeat echocardiograms in the future to see if his LV function normalizes.

## 2011-02-26 NOTE — Assessment & Plan Note (Signed)
I discussed the importance of avoiding alcohol. He is not drinking at this point. I think this is the primary issue causing both his atrial fibrillation and potentially cardiomyopathy.

## 2011-02-26 NOTE — Progress Notes (Signed)
HPI: [name] is a 35 yo male with a h/o AFib and NICM. NICM thought to be due to alcohol or tachy mediated. He has a h/o AFib previously on coumadin. Previous echo in 2009 demonstrated improved EF 55-60%, mild to mod LAE, trivial MR. Cath in 2008 demonstrated normal coronary arteries. Patient seen again on February 24th 2012 with recurrent atrial fibrillation. He had been placed back on his Toprol and on Pradaxa. He had an echocardiogram in March of 2012 that showed severely reduced LV function, biatrial enlargement and reduced RV function. There was mild mitral regurgitation. He recently underwent cardioversion of his atrial fibrillation. I last saw him in April of 2012. Repeat echocardiogram in June of 2012 showed an ejection fraction of 35-40%, mild to moderate left atrial enlargement and mild mitral regurgitation. Since he was last seen, the patient denies any dyspnea on exertion, orthopnea, PND, pedal edema, palpitations, syncope or chest pain.    Current Outpatient Prescriptions  Medication Sig Dispense Refill  . furosemide (LASIX) 20 MG tablet prn      . lisinopril (PRINIVIL,ZESTRIL) 10 MG tablet Take 1 tablet (10 mg total) by mouth daily.  30 tablet  6  . metoprolol (TOPROL-XL) 100 MG 24 hr tablet Take 100 mg by mouth 2 (two) times daily.       . potassium chloride (KLOR-CON) 10 MEQ CR tablet as needed.      Marland Kitchen DISCONTD: dabigatran (PRADAXA) 150 MG CAPS Take 150 mg by mouth every 12 (twelve) hours.           Past Medical History  Diagnosis Date  . Nonischemic cardiomyopathy     EF improved; ? 2/2 ETOH vs. tachy mediated;  Echo 10/2007: EF 55-60%; mild to mod LAE,; trivial MR;    Cath 09/2006: normal cors  . Paroxysmal a-fib   . HTN (hypertension)     Past Surgical History  Procedure Date  . Anterior cruciate ligament repair   . Cardiac catheterization 10/05/06    History   Social History  . Marital Status: Married    Spouse Name: N/A    Number of Children: N/A  . Years of  Education: N/A   Occupational History  . Not on file.   Social History Main Topics  . Smoking status: Former Games developer  . Smokeless tobacco: Not on file   Comment: quit appox 12 yrs ago  . Alcohol Use: 7.2 oz/week    12 Cans of beer per week  . Drug Use: No  . Sexually Active: Not on file   Other Topics Concern  . Not on file   Social History Narrative  . No narrative on file    ROS: no fevers or chills, productive cough, hemoptysis, dysphasia, odynophagia, melena, hematochezia, dysuria, hematuria, rash, seizure activity, orthopnea, PND, pedal edema, claudication. Remaining systems are negative.  Physical Exam: Well-developed well-nourished in no acute distress.  Skin is warm and dry.  HEENT is normal.  Neck is supple. No thyromegaly.  Chest is clear to auscultation with normal expansion.  Cardiovascular exam is regular rate and rhythm.  Abdominal exam nontender or distended. No masses palpated. Extremities show no edema. neuro grossly intact  ECG Normal sinus rhythm at a rate of 64. No ST changes.

## 2011-05-17 LAB — BASIC METABOLIC PANEL
Calcium: 8.7
Chloride: 109
Creatinine, Ser: 0.69
GFR calc Af Amer: >60
Sodium: 138

## 2011-05-17 LAB — PROTIME-INR
INR: 0.9
Prothrombin Time: 11.9

## 2012-03-14 ENCOUNTER — Other Ambulatory Visit: Payer: Self-pay | Admitting: Cardiology

## 2012-03-14 NOTE — Telephone Encounter (Signed)
Pt made appt to see Dr Jens Som [phone]

## 2012-04-19 ENCOUNTER — Ambulatory Visit: Payer: 59 | Admitting: Cardiology

## 2012-05-05 ENCOUNTER — Ambulatory Visit: Payer: 59 | Admitting: Cardiology

## 2013-04-09 ENCOUNTER — Other Ambulatory Visit: Payer: Self-pay | Admitting: Cardiology

## 2013-04-09 NOTE — Telephone Encounter (Signed)
Havent seen patient since 2012 will route this to Dr. Jens Som nurse for further review

## 2013-07-27 ENCOUNTER — Other Ambulatory Visit: Payer: Self-pay | Admitting: Cardiology

## 2013-08-01 ENCOUNTER — Other Ambulatory Visit: Payer: Self-pay | Admitting: Cardiology

## 2013-08-02 ENCOUNTER — Other Ambulatory Visit: Payer: Self-pay

## 2013-08-02 MED ORDER — METOPROLOL SUCCINATE ER 100 MG PO TB24: ORAL_TABLET | ORAL | Status: DC

## 2013-09-14 ENCOUNTER — Ambulatory Visit: Payer: 59 | Admitting: Cardiology

## 2013-09-21 ENCOUNTER — Encounter: Payer: Self-pay | Admitting: Cardiology

## 2013-09-21 ENCOUNTER — Ambulatory Visit (INDEPENDENT_AMBULATORY_CARE_PROVIDER_SITE_OTHER): Payer: BC Managed Care – PPO | Admitting: Cardiology

## 2013-09-21 VITALS — BP 158/98 | HR 57 | Ht 72.0 in | Wt 316.4 lb

## 2013-09-21 DIAGNOSIS — I10 Essential (primary) hypertension: Secondary | ICD-10-CM

## 2013-09-21 DIAGNOSIS — I4891 Unspecified atrial fibrillation: Secondary | ICD-10-CM

## 2013-09-21 DIAGNOSIS — I428 Other cardiomyopathies: Secondary | ICD-10-CM

## 2013-09-21 MED ORDER — METOPROLOL SUCCINATE ER 100 MG PO TB24: ORAL_TABLET | ORAL | Status: DC

## 2013-09-21 MED ORDER — FUROSEMIDE 20 MG PO TABS: 20.0000 mg | ORAL_TABLET | Freq: Every day | ORAL | Status: DC | PRN

## 2013-09-21 MED ORDER — LISINOPRIL 10 MG PO TABS: 10.0000 mg | ORAL_TABLET | Freq: Every day | ORAL | Status: DC

## 2013-09-21 MED ORDER — POTASSIUM CHLORIDE ER 10 MEQ PO TBCR: 10.0000 meq | EXTENDED_RELEASE_TABLET | Freq: Every day | ORAL | Status: DC | PRN

## 2013-09-21 NOTE — Progress Notes (Signed)
      HPI: FU AFib and NICM. NICM thought to be due to alcohol or tachy mediated. He has a h/o AFib previously on coumadin. Cath in 2008 demonstrated normal coronary arteries. Patient seen again on February 24th 2012 with recurrent atrial fibrillation. He was placed back on his Toprol and on Pradaxa. He had an echocardiogram in March of 2012 that showed severely reduced LV function, biatrial enlargement and reduced RV function. There was mild mitral regurgitation. He then underwent cardioversion of his atrial fibrillation. Repeat echocardiogram in June of 2012 showed an ejection fraction of 35-40%, mild to moderate left atrial enlargement and mild mitral regurgitation. Previously discontinued pradaxa on his own and declined at last ov. I have not seen him since July of 2012. The patient denies any dyspnea on exertion, orthopnea, PND, pedal edema, palpitations, syncope or chest pain. Has resumed ETOH.    Current Outpatient Prescriptions  Medication Sig Dispense Refill  . aspirin (BAYER ASPIRIN) 325 MG tablet Take 1 tablet (325 mg total) by mouth daily.  100 tablet  3  . furosemide (LASIX) 20 MG tablet prn      . metoprolol succinate (TOPROL-XL) 100 MG 24 hr tablet TAKE 1 TABLET BY MOUTH TWO TIMES A DAY  60 tablet  1  . potassium chloride (KLOR-CON) 10 MEQ CR tablet as needed.       No current facility-administered medications for this visit.     Past Medical History  Diagnosis Date  . Nonischemic cardiomyopathy     EF improved; ? 2/2 ETOH vs. tachy mediated;  Echo 10/2007: EF 55-60%; mild to mod LAE,; trivial MR;    Cath 09/2006: normal cors  . Paroxysmal a-fib   . HTN (hypertension)     Past Surgical History  Procedure Laterality Date  . Anterior cruciate ligament repair    . Cardiac catheterization  10/05/06    History   Social History  . Marital Status: Married    Spouse Name: N/A    Number of Children: N/A  . Years of Education: N/A   Occupational History  . Not on file.    Social History Main Topics  . Smoking status: Former Games developer  . Smokeless tobacco: Not on file     Comment: quit appox 12 yrs ago  . Alcohol Use: 7.2 oz/week    12 Cans of beer per week  . Drug Use: No  . Sexual Activity: Not on file   Other Topics Concern  . Not on file   Social History Narrative  . No narrative on file    ROS: no fevers or chills, productive cough, hemoptysis, dysphasia, odynophagia, melena, hematochezia, dysuria, hematuria, rash, seizure activity, orthopnea, PND, pedal edema, claudication. Remaining systems are negative.  Physical Exam: Well-developed obese in no acute distress.  Skin is warm and dry.  HEENT is normal.  Neck is supple.  Chest is clear to auscultation with normal expansion.  Cardiovascular exam is regular rate and rhythm.  Abdominal exam nontender or distended. No masses palpated. Extremities show no edema. neuro grossly intact  ECG sinus rhythm at a rate of 57.

## 2013-09-21 NOTE — Assessment & Plan Note (Signed)
Blood pressure elevated. Add lisinopril 10 mg daily both for blood pressure and cardiomyopathy. Check potassium and renal function in one week.

## 2013-09-21 NOTE — Assessment & Plan Note (Signed)
Patient remains in sinus rhythm. Continue Toprol. Continue aspirin. Embolic risk factors of hypertension and CHF. Previously DCed pradaxa on his own and history of noncompliance with FU and ETOH. Continue ASA for now. Will consider NOAC in future if he abstains from ETOH and demonstrates compliance with FU.

## 2013-09-21 NOTE — Patient Instructions (Signed)
Your physician wants you to follow-up in: 6 MONTHS WITH DR Jens Som You will receive a reminder letter in the mail two months in advance. If you don't receive a letter, please call our office to schedule the follow-up appointment.   START LISINOPRIL 10 MG ONCE DAILY  Your physician recommends that you return for lab work in: ONE WEEK  Your physician has requested that you have an echocardiogram. Echocardiography is a painless test that uses sound waves to create images of your heart. It provides your doctor with information about the size and shape of your heart and how well your heart's chambers and valves are working. This procedure takes approximately one hour. There are no restrictions for this procedure.

## 2013-09-21 NOTE — Assessment & Plan Note (Signed)
Patient counseled on discontinuing. 

## 2013-09-21 NOTE — Assessment & Plan Note (Signed)
Previously felt to be either tachycardia mediated or related to alcohol. Repeat echocardiogram. Continue Toprol. Resume ACE inhibitor. He is not taking this at present. Renal function in one week.

## 2013-09-28 ENCOUNTER — Other Ambulatory Visit (INDEPENDENT_AMBULATORY_CARE_PROVIDER_SITE_OTHER): Payer: BC Managed Care – PPO

## 2013-09-28 DIAGNOSIS — I4891 Unspecified atrial fibrillation: Secondary | ICD-10-CM

## 2013-10-01 LAB — BASIC METABOLIC PANEL
BUN: 14 mg/dL (ref 6–23)
CALCIUM: 8.9 mg/dL (ref 8.4–10.5)
CHLORIDE: 103 meq/L (ref 96–112)
CO2: 28 mEq/L (ref 19–32)
CREATININE: 0.9 mg/dL (ref 0.4–1.5)
GFR: 96.49 mL/min (ref >60.00)
Glucose, Bld: 83 mg/dL (ref 70–99)
Potassium: 4.3 mEq/L (ref 3.5–5.1)
SODIUM: 138 meq/L (ref 135–145)

## 2013-10-10 ENCOUNTER — Encounter: Payer: Self-pay | Admitting: *Deleted

## 2013-12-21 ENCOUNTER — Ambulatory Visit (HOSPITAL_COMMUNITY): Payer: BC Managed Care – PPO | Attending: Internal Medicine | Admitting: Radiology

## 2013-12-21 DIAGNOSIS — I4891 Unspecified atrial fibrillation: Secondary | ICD-10-CM | POA: Insufficient documentation

## 2013-12-21 DIAGNOSIS — I10 Essential (primary) hypertension: Secondary | ICD-10-CM | POA: Insufficient documentation

## 2013-12-21 DIAGNOSIS — Z6841 Body Mass Index (BMI) 40.0 and over, adult: Secondary | ICD-10-CM | POA: Insufficient documentation

## 2013-12-21 DIAGNOSIS — F1010 Alcohol abuse, uncomplicated: Secondary | ICD-10-CM | POA: Insufficient documentation

## 2013-12-21 DIAGNOSIS — I428 Other cardiomyopathies: Secondary | ICD-10-CM | POA: Insufficient documentation

## 2013-12-21 NOTE — Progress Notes (Signed)
Echocardiogram performed.  

## 2013-12-28 ENCOUNTER — Encounter: Payer: Self-pay | Admitting: *Deleted

## 2014-10-02 ENCOUNTER — Telehealth: Payer: Self-pay | Admitting: Cardiology

## 2014-10-02 NOTE — Telephone Encounter (Signed)
Requested Prescriptions    No prescriptions requested or ordered in this encounter    1. Which medications need to be refilled? Metoprolol,Lisinopril,Potassium and Furosemide  2. Which pharmacy is medication to be sent to?[facility]   3. Do they need a 30 day or 90 day supply? 30  4. Would they like a call back once the medication has been sent to the pharmacy? yes

## 2014-10-03 ENCOUNTER — Other Ambulatory Visit: Payer: Self-pay | Admitting: *Deleted

## 2014-10-03 DIAGNOSIS — I10 Essential (primary) hypertension: Secondary | ICD-10-CM

## 2014-10-03 DIAGNOSIS — I4891 Unspecified atrial fibrillation: Secondary | ICD-10-CM

## 2014-10-03 DIAGNOSIS — R609 Edema, unspecified: Secondary | ICD-10-CM

## 2014-10-03 MED ORDER — POTASSIUM CHLORIDE ER 10 MEQ PO TBCR: 10.0000 meq | EXTENDED_RELEASE_TABLET | Freq: Every day | ORAL | Status: DC | PRN

## 2014-10-03 MED ORDER — LISINOPRIL 10 MG PO TABS: 10.0000 mg | ORAL_TABLET | Freq: Every day | ORAL | Status: DC

## 2014-10-03 MED ORDER — FUROSEMIDE 20 MG PO TABS: 20.0000 mg | ORAL_TABLET | Freq: Every day | ORAL | Status: DC | PRN

## 2014-10-03 MED ORDER — METOPROLOL SUCCINATE ER 100 MG PO TB24: ORAL_TABLET | ORAL | Status: DC

## 2014-10-04 ENCOUNTER — Other Ambulatory Visit: Payer: Self-pay | Admitting: Allergy and Immunology

## 2014-10-04 ENCOUNTER — Ambulatory Visit
Admission: RE | Admit: 2014-10-04 | Discharge: 2014-10-04 | Disposition: A | Discharge status: Home / Self Care | Payer: BLUE CROSS/BLUE SHIELD | Source: Ambulatory Visit | Attending: Allergy and Immunology | Admitting: Allergy and Immunology

## 2014-10-04 DIAGNOSIS — R059 Cough, unspecified: Secondary | ICD-10-CM

## 2014-10-04 DIAGNOSIS — R05 Cough: Secondary | ICD-10-CM

## 2014-11-02 NOTE — Progress Notes (Signed)
      HPI: FU AFib and NICM. NICM thought to be due to alcohol or tachy mediated previously. Cath in 2008 demonstrated normal coronary arteries. Previously discontinued pradaxa on his own and declined to resume. Also with h/o ETOH. Echo 5/15 showed LV function had improved with EF 60-65, mild biatrial enlargement. Since last seen, the patient denies any dyspnea on exertion, orthopnea, PND, pedal edema, palpitations, syncope or chest pain.   Current Outpatient Prescriptions  Medication Sig Dispense Refill  . aspirin (BAYER ASPIRIN) 325 MG tablet Take 1 tablet (325 mg total) by mouth daily. 100 tablet 3  . furosemide (LASIX) 20 MG tablet Take 1 tablet (20 mg total) by mouth daily as needed. prn 30 tablet 3  . lisinopril (PRINIVIL,ZESTRIL) 10 MG tablet Take 1 tablet (10 mg total) by mouth daily. 30 tablet 3  . metoprolol succinate (TOPROL-XL) 100 MG 24 hr tablet TAKE 1 TABLET BY MOUTH TWO TIMES A DAY 60 tablet 3  . potassium chloride (K-DUR) 10 MEQ tablet Take 1 tablet (10 mEq total) by mouth daily as needed. 30 tablet 3   No current facility-administered medications for this visit.     Past Medical History  Diagnosis Date  . Nonischemic cardiomyopathy     EF improved; ? 2/2 ETOH vs. tachy mediated;  Echo 10/2007: EF 55-60%; mild to mod LAE,; trivial MR;    Cath 09/2006: normal cors  . Paroxysmal a-fib   . HTN (hypertension)     Past Surgical History  Procedure Laterality Date  . Anterior cruciate ligament repair    . Cardiac catheterization  10/05/06    History   Social History  . Marital Status: Married    Spouse Name: N/A  . Number of Children: N/A  . Years of Education: N/A   Occupational History  . Not on file.   Social History Main Topics  . Smoking status: Former Games developer  . Smokeless tobacco: Not on file     Comment: quit appox 12 yrs ago  . Alcohol Use: 7.2 oz/week    12 Cans of beer per week  . Drug Use: No  . Sexual Activity: Not on file   Other Topics Concern    . Not on file   Social History Narrative    ROS: no fevers or chills, productive cough, hemoptysis, dysphasia, odynophagia, melena, hematochezia, dysuria, hematuria, rash, seizure activity, orthopnea, PND, pedal edema, claudication. Remaining systems are negative.  Physical Exam: Well-developed well-nourished in no acute distress.  Skin is warm and dry.  HEENT is normal.  Neck is supple.  Chest is clear to auscultation with normal expansion.  Cardiovascular exam is regular rate and rhythm.  Abdominal exam nontender or distended. No masses palpated. Extremities show no edema. neuro grossly intact  ECG sinus rhythm at a rate of 60. No ST changes.

## 2014-11-05 ENCOUNTER — Ambulatory Visit (INDEPENDENT_AMBULATORY_CARE_PROVIDER_SITE_OTHER): Payer: BLUE CROSS/BLUE SHIELD | Admitting: Cardiology

## 2014-11-05 ENCOUNTER — Encounter: Payer: Self-pay | Admitting: Cardiology

## 2014-11-05 VITALS — BP 124/86 | HR 60 | Ht 72.0 in | Wt 310.0 lb

## 2014-11-05 DIAGNOSIS — I4891 Unspecified atrial fibrillation: Secondary | ICD-10-CM

## 2014-11-05 DIAGNOSIS — Z789 Other specified health status: Secondary | ICD-10-CM

## 2014-11-05 DIAGNOSIS — F1099 Alcohol use, unspecified with unspecified alcohol-induced disorder: Secondary | ICD-10-CM

## 2014-11-05 DIAGNOSIS — Z7289 Other problems related to lifestyle: Secondary | ICD-10-CM

## 2014-11-05 DIAGNOSIS — I10 Essential (primary) hypertension: Secondary | ICD-10-CM

## 2014-11-05 MED ORDER — POTASSIUM CHLORIDE ER 10 MEQ PO TBCR: 10.0000 meq | EXTENDED_RELEASE_TABLET | Freq: Every day | ORAL | Status: DC | PRN

## 2014-11-05 MED ORDER — METOPROLOL SUCCINATE ER 100 MG PO TB24: ORAL_TABLET | ORAL | Status: DC

## 2014-11-05 MED ORDER — LISINOPRIL 10 MG PO TABS: 10.0000 mg | ORAL_TABLET | Freq: Every day | ORAL | Status: DC

## 2014-11-05 MED ORDER — FUROSEMIDE 20 MG PO TABS: 20.0000 mg | ORAL_TABLET | Freq: Every day | ORAL | Status: DC | PRN

## 2014-11-05 NOTE — Patient Instructions (Addendum)
Your physician wants you to follow-up in: ONE YEAR WITH DR CRENSHAW You will receive a reminder letter in the mail two months in advance. If you don't receive a letter, please call our office to schedule the follow-up appointment.  

## 2014-11-05 NOTE — Assessment & Plan Note (Signed)
Previously felt to be tachycardia mediated versus alcohol induced. Improved. Continue beta blocker and ACE inhibitor.

## 2014-11-05 NOTE — Assessment & Plan Note (Signed)
Patient remains in sinus rhythm. Continue Toprol. Continue aspirin. Embolic risk factors of hypertension and CHF. However he did have problems with noncompliance and alcohol previously. This appears to have improved. His A. Fib appeared to have occurred in the setting of his previous nonischemic cardiomyopathy which has now resolved. I will not anticoagulate at this point.

## 2014-11-05 NOTE — Assessment & Plan Note (Signed)
Patient counseled on avoiding. 

## 2014-11-05 NOTE — Assessment & Plan Note (Signed)
Blood pressure controlled. Continue present medications. 

## 2014-12-31 ENCOUNTER — Ambulatory Visit
Admission: RE | Admit: 2014-12-31 | Discharge: 2014-12-31 | Disposition: A | Discharge status: Home / Self Care | Payer: BLUE CROSS/BLUE SHIELD | Source: Ambulatory Visit | Attending: Internal Medicine | Admitting: Internal Medicine

## 2014-12-31 ENCOUNTER — Other Ambulatory Visit: Payer: Self-pay | Admitting: Internal Medicine

## 2014-12-31 DIAGNOSIS — I809 Phlebitis and thrombophlebitis of unspecified site: Secondary | ICD-10-CM

## 2015-01-09 ENCOUNTER — Telehealth: Payer: Self-pay | Admitting: *Deleted

## 2015-01-09 ENCOUNTER — Telehealth: Payer: Self-pay | Admitting: Internal Medicine
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
## 2015-01-09 NOTE — Telephone Encounter (Signed)
[name] (wife of [name]) is calling on his behalf regarding recent  history of superficial thrombophlebitis.  Mrs. [name] states last week her husband was evaluated by Shane Noble MD [facility]) and had an "ultrasound of his left leg" and was diagnosed with superficial thrombophlebitis in his left lower extremity. Verified in [facility] that left lower Extremity venous doppler ultrasound was completed on 12-31-2014 and that no DVT was found and was positive for superficial thrombophlebitis in left lower extremity.  [name] states that this week her husband "has developed two more superificial clots in his left leg and had additional ultrasound of his left leg at [facility]."  [name] states that Shane Noble MD is following [name] and has "put him on Pradaxa or Xarelto."  She is wondering if he need to be seen by physician at [facility].  Discussed with her at length the difference between superficial thrombophlebitis and DVT and signs and symptoms of DVT.  Encouraged her to have her husband [name])  continue medical treatment with Shane Noble MD and that [facility] was available to see him [name]) if [name] felt a referral was indicated. [name] verbalized understanding and will call [facility] for further questions or concerns if needed.

## 2015-01-19 ENCOUNTER — Encounter (HOSPITAL_BASED_OUTPATIENT_CLINIC_OR_DEPARTMENT_OTHER): Payer: Self-pay | Admitting: Emergency Medicine

## 2015-01-19 ENCOUNTER — Emergency Department (HOSPITAL_BASED_OUTPATIENT_CLINIC_OR_DEPARTMENT_OTHER): Payer: BLUE CROSS/BLUE SHIELD

## 2015-01-19 ENCOUNTER — Emergency Department (HOSPITAL_BASED_OUTPATIENT_CLINIC_OR_DEPARTMENT_OTHER)
Admission: EM | Admit: 2015-01-19 | Discharge: 2015-01-19 | Disposition: A | Discharge status: Home / Self Care | Payer: BLUE CROSS/BLUE SHIELD | Attending: Emergency Medicine | Admitting: Emergency Medicine

## 2015-01-19 DIAGNOSIS — I10 Essential (primary) hypertension: Secondary | ICD-10-CM | POA: Insufficient documentation

## 2015-01-19 DIAGNOSIS — Z7982 Long term (current) use of aspirin: Secondary | ICD-10-CM | POA: Insufficient documentation

## 2015-01-19 DIAGNOSIS — Z87891 Personal history of nicotine dependence: Secondary | ICD-10-CM | POA: Diagnosis not present

## 2015-01-19 DIAGNOSIS — I480 Paroxysmal atrial fibrillation: Secondary | ICD-10-CM | POA: Insufficient documentation

## 2015-01-19 DIAGNOSIS — S299XXA Unspecified injury of thorax, initial encounter: Secondary | ICD-10-CM

## 2015-01-19 DIAGNOSIS — S29001A Unspecified injury of muscle and tendon of front wall of thorax, initial encounter: Secondary | ICD-10-CM | POA: Diagnosis not present

## 2015-01-19 DIAGNOSIS — Z23 Encounter for immunization: Secondary | ICD-10-CM | POA: Diagnosis not present

## 2015-01-19 DIAGNOSIS — Y9289 Other specified places as the place of occurrence of the external cause: Secondary | ICD-10-CM | POA: Insufficient documentation

## 2015-01-19 DIAGNOSIS — S0993XA Unspecified injury of face, initial encounter: Secondary | ICD-10-CM | POA: Diagnosis present

## 2015-01-19 DIAGNOSIS — Y998 Other external cause status: Secondary | ICD-10-CM | POA: Diagnosis not present

## 2015-01-19 DIAGNOSIS — Y9389 Activity, other specified: Secondary | ICD-10-CM | POA: Diagnosis not present

## 2015-01-19 DIAGNOSIS — S0121XA Laceration without foreign body of nose, initial encounter: Secondary | ICD-10-CM | POA: Diagnosis not present

## 2015-01-19 DIAGNOSIS — Z79899 Other long term (current) drug therapy: Secondary | ICD-10-CM | POA: Insufficient documentation

## 2015-01-19 MED ORDER — HYDROCODONE-ACETAMINOPHEN 5-325 MG PO TABS: 1.0000 | ORAL_TABLET | Freq: Four times a day (QID) | ORAL | Status: DC | PRN

## 2015-01-19 MED ORDER — LIDOCAINE HCL 2 % IJ SOLN
20.0000 mL | Freq: Once | INTRAMUSCULAR | Status: DC
  Filled 2015-01-19: qty 20

## 2015-01-19 MED ORDER — CEPHALEXIN 500 MG PO CAPS: 500.0000 mg | ORAL_CAPSULE | Freq: Three times a day (TID) | ORAL | Status: DC

## 2015-01-19 MED ORDER — TETANUS-DIPHTH-ACELL PERTUSSIS 5-2.5-18.5 LF-MCG/0.5 IM SUSP
0.5000 mL | Freq: Once | INTRAMUSCULAR | Status: AC
  Administered 2015-01-19: 0.5 mL via INTRAMUSCULAR
  Filled 2015-01-19: qty 0.5

## 2015-01-19 MED ORDER — HYDROCODONE-ACETAMINOPHEN 5-325 MG PO TABS
2.0000 | ORAL_TABLET | Freq: Once | ORAL | Status: AC
  Administered 2015-01-19: 1 via ORAL
  Filled 2015-01-19: qty 2
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
## 2015-01-19 NOTE — ED Notes (Signed)
Laceration at Rt nare cleaned with Peroxide, still no active bleeding noted

## 2015-01-19 NOTE — ED Notes (Signed)
Laceration on nose, Rt nare, no active bleeding noted at this time.

## 2015-01-19 NOTE — Discharge Instructions (Signed)
Facial Laceration  A facial laceration is a cut on the face. These injuries can be painful and cause bleeding. Lacerations usually heal quickly, but they need special care to reduce scarring. DIAGNOSIS  Your health care provider will take a medical history, ask for details about how the injury occurred, and examine the wound to determine how deep the cut is. TREATMENT  Some facial lacerations may not require closure. Others may not be able to be closed because of an increased risk of infection. The risk of infection and the chance for successful closure will depend on various factors, including the amount of time since the injury occurred. The wound may be cleaned to help prevent infection. If closure is appropriate, pain medicines may be given if needed. Your health care provider will use stitches (sutures), wound glue (adhesive), or skin adhesive strips to repair the laceration. These tools bring the skin edges together to allow for faster healing and a better cosmetic outcome. If needed, you may also be given a tetanus shot. HOME CARE INSTRUCTIONS  Only take over-the-counter or prescription medicines as directed by your health care provider.  Follow your health care provider's instructions for wound care. These instructions will vary depending on the technique used for closing the wound. For Sutures:  Keep the wound clean and dry.   If you were given a bandage (dressing), you should change it at least once a day. Also change the dressing if it becomes wet or dirty, or as directed by your health care provider.   Wash the wound with soap and water 2 times a day. Rinse the wound off with water to remove all soap. Pat the wound dry with a clean towel.   After cleaning, apply a thin layer of the antibiotic ointment recommended by your health care provider. This will help prevent infection and keep the dressing from sticking.   You may shower as usual after the first 24 hours. Do not soak the  wound in water until the sutures are removed.   Get your sutures removed as directed by your health care provider. With facial lacerations, sutures should usually be taken out after 4-5 days to avoid stitch marks.   Wait a few days after your sutures are removed before applying any makeup. For Skin Adhesive Strips:  Keep the wound clean and dry.   Do not get the skin adhesive strips wet. You may bathe carefully, using caution to keep the wound dry.   If the wound gets wet, pat it dry with a clean towel.   Skin adhesive strips will fall off on their own. You may trim the strips as the wound heals. Do not remove skin adhesive strips that are still stuck to the wound. They will fall off in time.  For Wound Adhesive:  You may briefly wet your wound in the shower or bath. Do not soak or scrub the wound. Do not swim. Avoid periods of heavy sweating until the skin adhesive has fallen off on its own. After showering or bathing, gently pat the wound dry with a clean towel.   Do not apply liquid medicine, cream medicine, ointment medicine, or makeup to your wound while the skin adhesive is in place. This may loosen the film before your wound is healed.   If a dressing is placed over the wound, be careful not to apply tape directly over the skin adhesive. This may cause the adhesive to be pulled off before the wound is healed.   Avoid  prolonged exposure to sunlight or tanning lamps while the skin adhesive is in place.  The skin adhesive will usually remain in place for 5-10 days, then naturally fall off the skin. Do not pick at the adhesive film.  After Healing: Once the wound has healed, cover the wound with sunscreen during the day for 1 full year. This can help minimize scarring. Exposure to ultraviolet light in the first year will darken the scar. It can take 1-2 years for the scar to lose its redness and to heal completely.  SEEK IMMEDIATE MEDICAL CARE IF:  You have redness, pain, or  swelling around the wound.   You see ayellowish-white fluid (pus) coming from the wound.   You have chills or a fever.  MAKE SURE YOU:  Understand these instructions.  Will watch your condition.  Will get help right away if you are not doing well or get worse. Document Released: 09/16/2004 Document Revised: 05/30/2013 Document Reviewed: 03/22/2013 [facility] Patient Information 2015 [place], [place]. This information is not intended to replace advice given to you by your health care provider. Make sure you discuss any questions you have with your health care provider.  Blunt Chest Trauma Blunt chest trauma is an injury caused by a blow to the chest. These chest injuries can be very painful. Blunt chest trauma often results in bruised or broken (fractured) ribs. Most cases of bruised and fractured ribs from blunt chest traumas get better after 1 to 3 weeks of rest and pain medicine. Often, the soft tissue in the chest wall is also injured, causing pain and bruising. Internal organs, such as the heart and lungs, may also be injured. Blunt chest trauma can lead to serious medical problems. This injury requires immediate medical care. CAUSES   Motor vehicle collisions.  Falls.  Physical violence.  Sports injuries. SYMPTOMS   Chest pain. The pain may be worse when you move or breathe deeply.  Shortness of breath.  Lightheadedness.  Bruising.  Tenderness.  Swelling. DIAGNOSIS  Your caregiver will do a physical exam. X-rays may be taken to look for fractures. However, minor rib fractures may not show up on X-rays until a few days after the injury. If a more serious injury is suspected, further imaging tests may be done. This may include ultrasounds, computed tomography (CT) scans, or magnetic resonance imaging (MRI). TREATMENT  Treatment depends on the severity of your injury. Your caregiver may prescribe pain medicines and deep breathing exercises. HOME CARE  INSTRUCTIONS  Limit your activities until you can move around without much pain.  Do not do any strenuous work until your injury is healed.  Put ice on the injured area.  Put ice in a plastic bag.  Place a towel between your skin and the bag.  Leave the ice on for 15-20 minutes, 03-04 times a day.  You may wear a rib belt as directed by your caregiver to reduce pain.  Practice deep breathing as directed by your caregiver to keep your lungs clear.  Only take over-the-counter or prescription medicines for pain, fever, or discomfort as directed by your caregiver. SEEK IMMEDIATE MEDICAL CARE IF:   You have increasing pain or shortness of breath.  You cough up blood.  You have nausea, vomiting, or abdominal pain.  You have a fever.  You feel dizzy, weak, or you faint. MAKE SURE YOU:  Understand these instructions.  Will watch your condition.  Will get help right away if you are not doing well or get worse.  Document Released: 09/16/2004 Document Revised: 11/01/2011 Document Reviewed: 05/26/2011 [facility] Patient Information 2015 [place], [place]. This information is not intended to replace advice given to you by your health care provider. Make sure you discuss any questions you have with your health care provider.

## 2015-01-21 ENCOUNTER — Other Ambulatory Visit: Payer: Self-pay | Admitting: Medical Oncology

## 2015-01-21 DIAGNOSIS — I821 Thrombophlebitis migrans: Secondary | ICD-10-CM

## 2015-01-23 ENCOUNTER — Ambulatory Visit: Payer: BLUE CROSS/BLUE SHIELD

## 2015-01-23 ENCOUNTER — Other Ambulatory Visit (HOSPITAL_BASED_OUTPATIENT_CLINIC_OR_DEPARTMENT_OTHER): Payer: BLUE CROSS/BLUE SHIELD

## 2015-01-23 ENCOUNTER — Encounter: Payer: Self-pay | Admitting: Internal Medicine

## 2015-01-23 ENCOUNTER — Ambulatory Visit (HOSPITAL_BASED_OUTPATIENT_CLINIC_OR_DEPARTMENT_OTHER): Payer: BLUE CROSS/BLUE SHIELD | Admitting: Internal Medicine

## 2015-01-23 ENCOUNTER — Other Ambulatory Visit: Payer: Self-pay | Admitting: Internal Medicine

## 2015-01-23 VITALS — BP 132/82 | HR 58 | Temp 97.6°F | Resp 16 | Ht 72.0 in | Wt 302.4 lb

## 2015-01-23 DIAGNOSIS — I821 Thrombophlebitis migrans: Secondary | ICD-10-CM

## 2015-01-23 DIAGNOSIS — I80202 Phlebitis and thrombophlebitis of unspecified deep vessels of left lower extremity: Secondary | ICD-10-CM

## 2015-01-23 DIAGNOSIS — I809 Phlebitis and thrombophlebitis of unspecified site: Secondary | ICD-10-CM

## 2015-01-23 DIAGNOSIS — Z7901 Long term (current) use of anticoagulants: Secondary | ICD-10-CM | POA: Diagnosis not present

## 2015-01-23 LAB — COMPREHENSIVE METABOLIC PANEL (CC13)
ALBUMIN: 3.9 g/dL (ref 3.5–5.0)
ALT: 60 U/L — ABNORMAL HIGH (ref 0–55)
AST: 32 U/L (ref 5–34)
Alkaline Phosphatase: 77 U/L (ref 40–150)
Anion Gap: 8 mEq/L (ref 3–11)
BUN: 15.5 mg/dL (ref 7.0–26.0)
CO2: 28 meq/L (ref 22–29)
Calcium: 8.8 mg/dL (ref 8.4–10.4)
Chloride: 104 mEq/L (ref 98–109)
Creatinine: 0.9 mg/dL (ref 0.7–1.3)
EGFR: >90 mL/min/{1.73_m2} (ref >90)
GLUCOSE: 96 mg/dL (ref 70–140)
POTASSIUM: 4.6 meq/L (ref 3.5–5.1)
Sodium: 140 mEq/L (ref 136–145)
Total Bilirubin: 0.67 mg/dL (ref 0.20–1.20)
Total Protein: 7.1 g/dL (ref 6.4–8.3)

## 2015-01-23 LAB — CBC WITH DIFFERENTIAL/PLATELET
BASO%: 0.6 % (ref 0.0–2.0)
BASOS ABS: 0.1 10*3/uL (ref 0.0–0.1)
EOS%: 1.5 % (ref 0.0–7.0)
Eosinophils Absolute: 0.1 10*3/uL (ref 0.0–0.5)
HEMATOCRIT: 45.3 % (ref 38.4–49.9)
HEMOGLOBIN: 15.3 g/dL (ref 13.0–17.1)
LYMPH%: 22.9 % (ref 14.0–49.0)
MCH: 31.1 pg (ref 27.2–33.4)
MCHC: 33.9 g/dL (ref 32.0–36.0)
MCV: 91.7 fL (ref 79.3–98.0)
MONO#: 0.7 10*3/uL (ref 0.1–0.9)
MONO%: 7.1 % (ref 0.0–14.0)
NEUT#: 6.4 10*3/uL (ref 1.5–6.5)
NEUT%: 67.9 % (ref 39.0–75.0)
PLATELETS: 273 10*3/uL (ref 140–400)
RBC: 4.93 10*6/uL (ref 4.20–5.82)
RDW: 13 % (ref 11.0–14.6)
WBC: 9.5 10*3/uL (ref 4.0–10.3)
lymph#: 2.2 10*3/uL (ref 0.9–3.3)

## 2015-01-23 NOTE — Progress Notes (Signed)
Checked in new pt with no financial concerns. °
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
## 2015-10-28 ENCOUNTER — Other Ambulatory Visit: Payer: Self-pay | Admitting: Cardiology

## 2015-10-28 NOTE — Telephone Encounter (Signed)
Rx request sent to pharmacy.  

## 2016-01-22 ENCOUNTER — Other Ambulatory Visit: Payer: Self-pay | Admitting: Cardiology

## 2016-01-23 NOTE — Telephone Encounter (Signed)
Rx(s) sent to pharmacy electronically.  

## 2016-02-25 ENCOUNTER — Other Ambulatory Visit: Payer: Self-pay | Admitting: Cardiology

## 2016-03-24 ENCOUNTER — Other Ambulatory Visit: Payer: Self-pay | Admitting: Cardiology

## 2016-03-24 NOTE — Telephone Encounter (Signed)
Rx request sent to pharmacy.  

## 2016-04-20 ENCOUNTER — Ambulatory Visit
Admission: RE | Admit: 2016-04-20 | Discharge: 2016-04-20 | Disposition: A | Discharge status: Home / Self Care | Payer: Commercial Managed Care - HMO | Source: Ambulatory Visit | Attending: Nurse Practitioner | Admitting: Nurse Practitioner

## 2016-04-20 ENCOUNTER — Other Ambulatory Visit: Payer: Self-pay | Admitting: Nurse Practitioner

## 2016-04-20 DIAGNOSIS — R52 Pain, unspecified: Secondary | ICD-10-CM

## 2016-04-20 DIAGNOSIS — R609 Edema, unspecified: Secondary | ICD-10-CM

## 2016-04-21 ENCOUNTER — Other Ambulatory Visit: Payer: Self-pay | Admitting: Cardiology

## 2016-05-13 ENCOUNTER — Other Ambulatory Visit: Payer: Self-pay | Admitting: Cardiology

## 2016-05-14 ENCOUNTER — Other Ambulatory Visit: Payer: Self-pay | Admitting: *Deleted

## 2016-05-14 ENCOUNTER — Telehealth: Payer: Self-pay | Admitting: *Deleted

## 2016-05-14 MED ORDER — METOPROLOL SUCCINATE ER 100 MG PO TB24: 100.0000 mg | ORAL_TABLET | Freq: Every day | ORAL | 0 refills | Status: DC

## 2016-05-14 MED ORDER — METOPROLOL SUCCINATE ER 100 MG PO TB24: 100.0000 mg | ORAL_TABLET | Freq: Two times a day (BID) | ORAL | 2 refills | Status: DC

## 2016-05-14 NOTE — Telephone Encounter (Signed)
Spoke with pt, confirmed he is currently taking metoprolol succ 100 mg twice daily. New script sent to the pharmacy to last untilhis follow up appt.

## 2016-05-14 NOTE — Telephone Encounter (Signed)
Refill   04/21/2016 [facility]  Lewayne Bunting, MD  Cardiology   Medication Refill  Reason for call   Conversation: Medication Refill  (Newest Message First)        04/21/16 9:02 PM  Interface, Surescripts Out routed this conversation to [facility] Rx Refill Pool  Additional Documentation   Encounter Info:   Billing Info,   History,   Allergies,   Detailed Report     Orders Placed    None  Approved Medication Requests      Furosemide 20 mg Oral Daily      Metoprolol Succinate 100 mg Oral Daily      Potassium Chloride 10 MEQ 10 mEq Oral Daily     Medication

## 2016-05-19 ENCOUNTER — Other Ambulatory Visit: Payer: Self-pay | Admitting: Cardiology

## 2016-05-20 NOTE — Telephone Encounter (Signed)
Rx request sent to pharmacy.  

## 2016-06-30 NOTE — Progress Notes (Signed)
HPI: FU AFib and NICM. NICM thought to be due to alcohol or tachy mediated previously. Cath in 2008 demonstrated normal coronary arteries. Previously discontinued pradaxa on his own and declined to resume. Also with h/o ETOH. Echo 5/15 showed LV function had improved with EF 60-65, mild biatrial enlargement. Since last seen, the patient denies any dyspnea on exertion, orthopnea, PND, pedal edema, palpitations, syncope or chest pain.   Current Outpatient Prescriptions  Medication Sig Dispense Refill  . furosemide (LASIX) 20 MG tablet Take 1 tablet (20 mg total) by mouth daily. 30 tablet 2  . lisinopril (PRINIVIL,ZESTRIL) 10 MG tablet Take 1 tablet (10 mg total) by mouth daily. PLEASE CONTACT OFFICE FOR ADDITIONAL REFILLS 30 tablet 0  . metoprolol succinate (TOPROL-XL) 100 MG 24 hr tablet Take 1 tablet (100 mg total) by mouth 2 (two) times daily. 60 tablet 2  . potassium chloride (K-DUR) 10 MEQ tablet TAKE ONE TABLET BY MOUTH ONCE DAILY 15 tablet 4   No current facility-administered medications for this visit.      Past Medical History:  Diagnosis Date  . HTN (hypertension)   . Nonischemic cardiomyopathy (HCC)    EF improved; ? 2/2 ETOH vs. tachy mediated;  Echo 10/2007: EF 55-60%; mild to mod LAE,; trivial MR;    Cath 09/2006: normal cors  . Paroxysmal a-fib [facility])     Past Surgical History:  Procedure Laterality Date  . ANTERIOR CRUCIATE LIGAMENT REPAIR    . CARDIAC CATHETERIZATION  10/05/06    Social History   Social History  . Marital status: Married    Spouse name: N/A  . Number of children: N/A  . Years of education: N/A   Occupational History  . Not on file.   Social History Main Topics  . Smoking status: Former Smoker    Packs/day: 1.00    Years: 10.00    Quit date: 01/23/2004  . Smokeless tobacco: Never Used     Comment: quit appox 12 yrs ago  . Alcohol use 7.2 oz/week    12 Cans of beer per week  . Drug use: No  . Sexual activity: Not on file   Other  Topics Concern  . Not on file   Social History Narrative  . No narrative on file    Family History  Problem Relation Age of Onset  . Hypertension Father   . Alcohol abuse Father   . Alcohol abuse Mother     ROS: no fevers or chills, productive cough, hemoptysis, dysphasia, odynophagia, melena, hematochezia, dysuria, hematuria, rash, seizure activity, orthopnea, PND, pedal edema, claudication. Remaining systems are negative.  Physical Exam: Well-developed well-nourished in no acute distress.  Skin is warm and dry.  HEENT is normal.  Neck is supple.  Chest is clear to auscultation with normal expansion.  Cardiovascular exam is regular rate and rhythm.  Abdominal exam nontender or distended. No masses palpated. Extremities show no edema. neuro grossly intact  ECG-Sinus rhythm at a rate of 56. No ST changes.  A/P  1 Hypertension-blood pressure elevated. Increase lisinopril to 20 mg daily; bmet 2 weeks.  2 history of dilated cardiomyopathy-previously felt to be tachycardia mediated versus alcohol induced. Continue ACE inhibitor and beta blocker. Improved on most recent evaluation.  3 paroxysmal atrial fibrillation-patient remains in sinus rhythm. Continue beta blocker and aspirin. He has embolic risk factors of hypertension and congestive heart failure. However there were issues with compliance and alcohol previously. His previous atrial fibrillation occurred in the setting  of a nonischemic cardiomyopathy which has now resolved. I have not added anticoagulation based on the above.  4 history of alcohol abuse-we discussed the importance of reporting.  Olga MillersBrian Kynleigh Artz, MD

## 2016-07-05 ENCOUNTER — Encounter: Payer: Self-pay | Admitting: Cardiology

## 2016-07-05 ENCOUNTER — Ambulatory Visit (INDEPENDENT_AMBULATORY_CARE_PROVIDER_SITE_OTHER): Payer: Managed Care, Other (non HMO) | Admitting: Cardiology

## 2016-07-05 VITALS — BP 136/98 | HR 56 | Ht 72.0 in | Wt 313.0 lb

## 2016-07-05 DIAGNOSIS — Z789 Other specified health status: Secondary | ICD-10-CM | POA: Diagnosis not present

## 2016-07-05 DIAGNOSIS — I480 Paroxysmal atrial fibrillation: Secondary | ICD-10-CM

## 2016-07-05 DIAGNOSIS — Z7289 Other problems related to lifestyle: Secondary | ICD-10-CM

## 2016-07-05 DIAGNOSIS — I10 Essential (primary) hypertension: Secondary | ICD-10-CM | POA: Diagnosis not present

## 2016-07-05 MED ORDER — FUROSEMIDE 20 MG PO TABS: 20.0000 mg | ORAL_TABLET | Freq: Every day | ORAL | 3 refills | Status: DC

## 2016-07-05 MED ORDER — LISINOPRIL 20 MG PO TABS: 20.0000 mg | ORAL_TABLET | Freq: Every day | ORAL | 3 refills | Status: DC

## 2016-07-05 MED ORDER — METOPROLOL SUCCINATE ER 100 MG PO TB24: 100.0000 mg | ORAL_TABLET | Freq: Two times a day (BID) | ORAL | 3 refills | Status: DC

## 2016-07-05 MED ORDER — POTASSIUM CHLORIDE ER 10 MEQ PO TBCR: 10.0000 meq | EXTENDED_RELEASE_TABLET | Freq: Every day | ORAL | 3 refills | Status: DC

## 2016-07-05 NOTE — Patient Instructions (Signed)
Medication Instructions:   INCREASE LISINOPRIL TO 20 MG ONCE DAILY= 2 OF THE 10 MG TABLETS ONCE DAILY  Labwork:  Your physician recommends that you return for lab work in: 2 WEEKS  Follow-Up:  Your physician wants you to follow-up in: ONE YEAR WITH DR CRENSHAW You will receive a reminder letter in the mail two months in advance. If you don't receive a letter, please call our office to schedule the follow-up appointment.   If you need a refill on your cardiac medications before your next appointment, please call your pharmacy.    

## 2016-09-11 IMAGING — CR DG CHEST 2V
2 series · 2 of 2 positions shown · non-contrast
Comparison: 11/20/2007.

CLINICAL DATA: Two month history of cough.

EXAM:
CHEST  2 VIEW

[w chest pa]
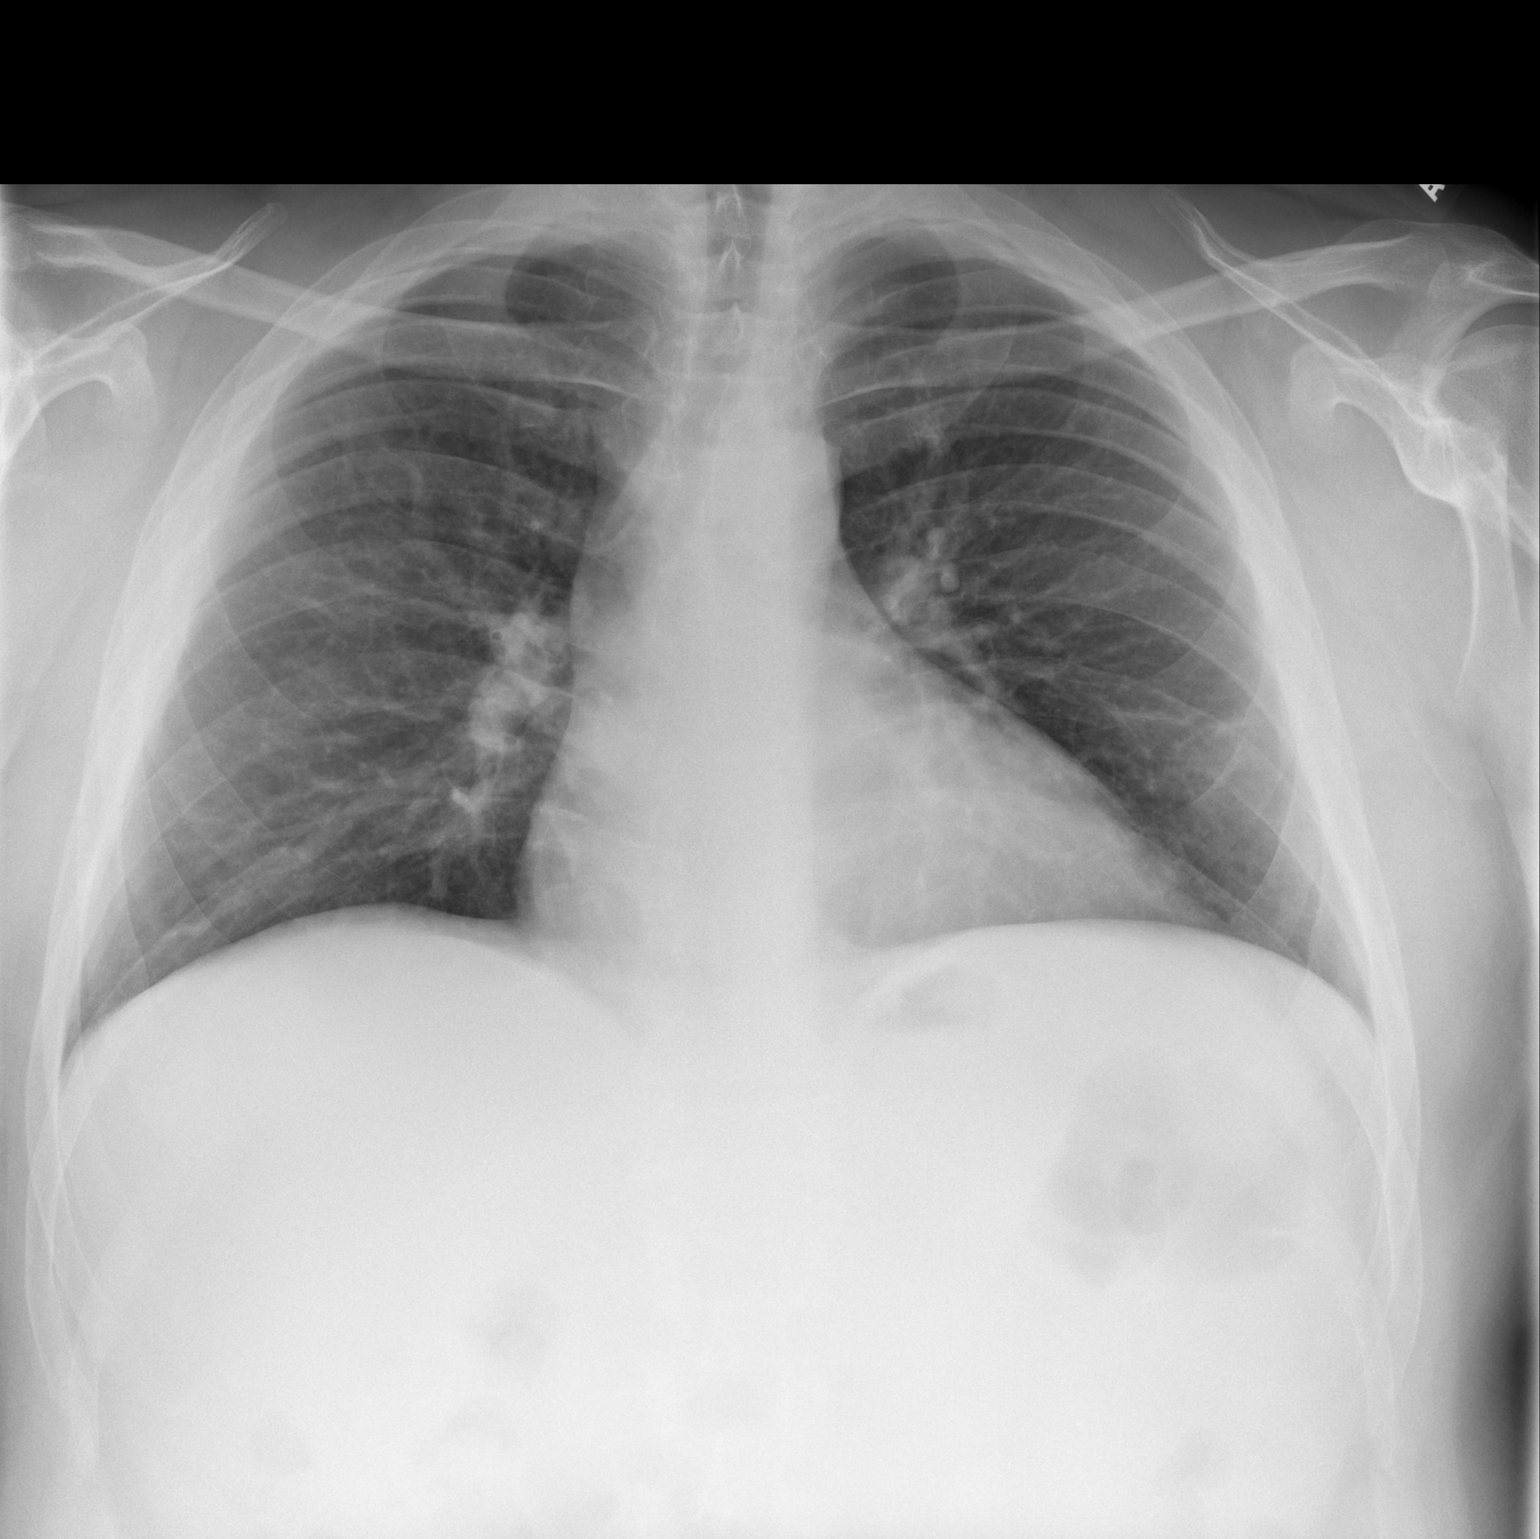

[w chest lat]
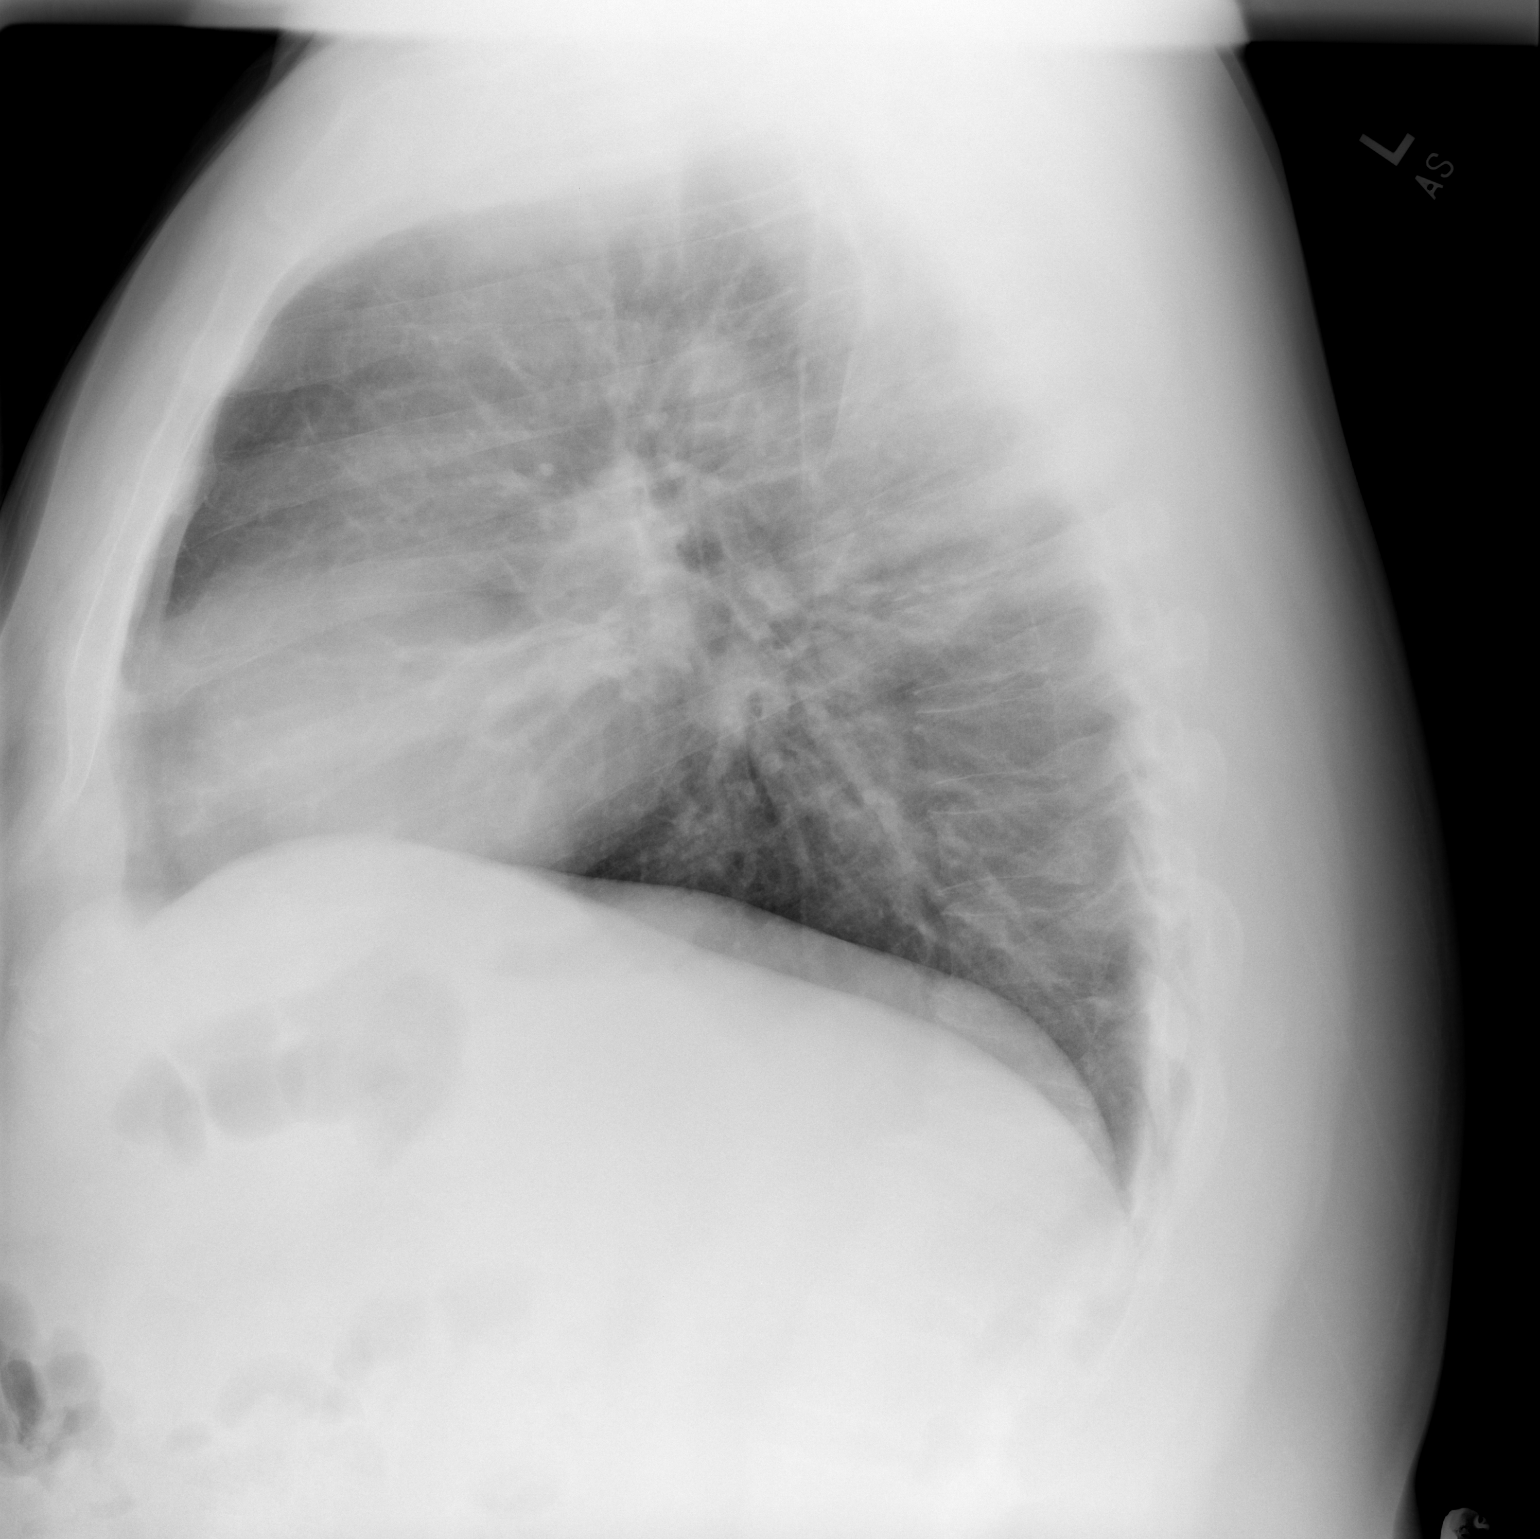

[2 of 2 positions shown; findings below may reference images not displayed]

FINDINGS: The cardiac silhouette, mediastinal and hilar contours are within
normal limits and stable. There are mild bronchitic changes with a
few streaky areas of atelectasis but no infiltrates, edema or
effusions. The bony thorax is intact. There are remote thoracolumbar
junction compression fractures.
IMPRESSION: Mild bronchitic changes could suggest acute bronchitis. No focal
infiltrates.

## 2016-10-23 DIAGNOSIS — M25462 Effusion, left knee: Secondary | ICD-10-CM | POA: Diagnosis not present

## 2016-10-23 DIAGNOSIS — R2232 Localized swelling, mass and lump, left upper limb: Secondary | ICD-10-CM | POA: Diagnosis not present

## 2016-10-23 DIAGNOSIS — M25562 Pain in left knee: Secondary | ICD-10-CM | POA: Diagnosis not present

## 2016-10-23 DIAGNOSIS — G8929 Other chronic pain: Secondary | ICD-10-CM | POA: Diagnosis not present

## 2016-10-30 DIAGNOSIS — G8929 Other chronic pain: Secondary | ICD-10-CM | POA: Diagnosis not present

## 2016-10-30 DIAGNOSIS — M25562 Pain in left knee: Secondary | ICD-10-CM | POA: Diagnosis not present

## 2016-11-08 DIAGNOSIS — S83272A Complex tear of lateral meniscus, current injury, left knee, initial encounter: Secondary | ICD-10-CM | POA: Diagnosis not present

## 2016-11-08 DIAGNOSIS — M25562 Pain in left knee: Secondary | ICD-10-CM | POA: Diagnosis not present

## 2016-11-08 DIAGNOSIS — G8929 Other chronic pain: Secondary | ICD-10-CM | POA: Diagnosis not present

## 2016-11-08 DIAGNOSIS — Z9889 Other specified postprocedural states: Secondary | ICD-10-CM | POA: Diagnosis not present

## 2016-11-17 ENCOUNTER — Telehealth: Payer: Self-pay | Admitting: *Deleted

## 2016-11-17 DIAGNOSIS — M79645 Pain in left finger(s): Secondary | ICD-10-CM | POA: Diagnosis not present

## 2016-11-17 DIAGNOSIS — M6740 Ganglion, unspecified site: Secondary | ICD-10-CM | POA: Diagnosis not present

## 2016-11-17 NOTE — Telephone Encounter (Signed)
Per dr Jens Som, okay to hold aspirin prior to left knee scope, partial lateral mensicectomy, chondroplasty, KA-debridement/shaving (chondroplasty) faxed to the number provided.

## 2016-12-08 IMAGING — US US EXTREM LOW VENOUS*L*
1 series · 13 of 24 positions shown · non-contrast
Comparison: None.

CLINICAL DATA: History of bruising involving the left by for 1
week. History of varicose veins. Evaluate for DVT.



[Series 1: us extrem low venous*left* · 13 of 49 slices shown]
[im 1/49]
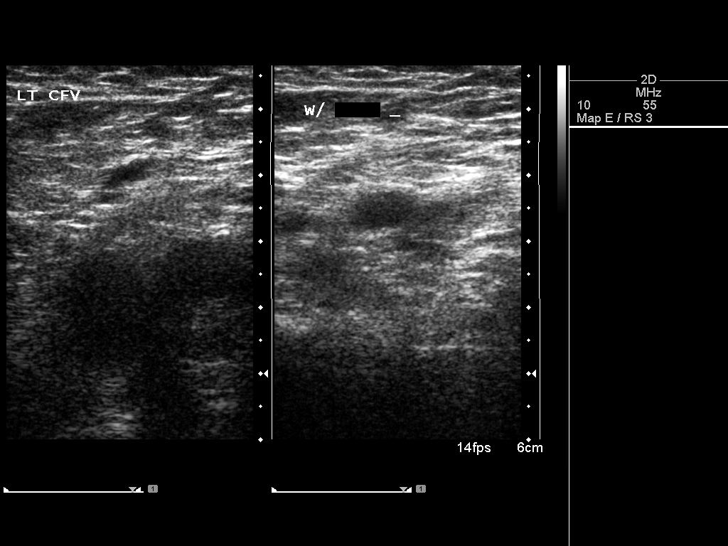
[im 5/49]
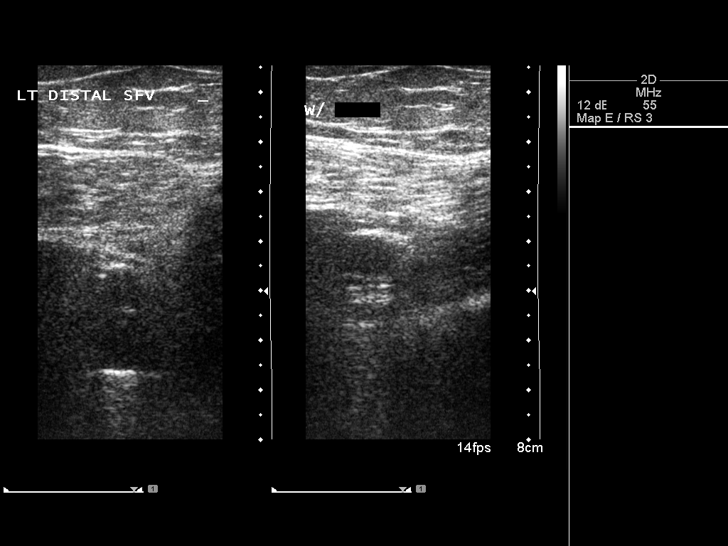
[im 9/49]
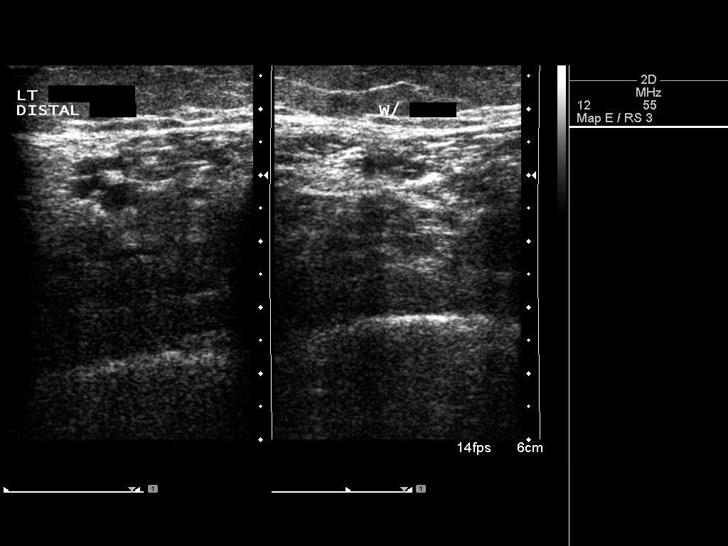
[im 13/49]
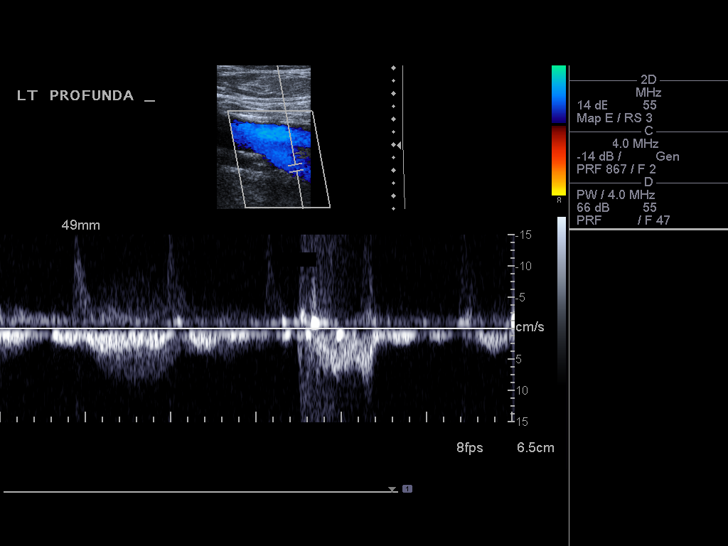
[im 17/49]
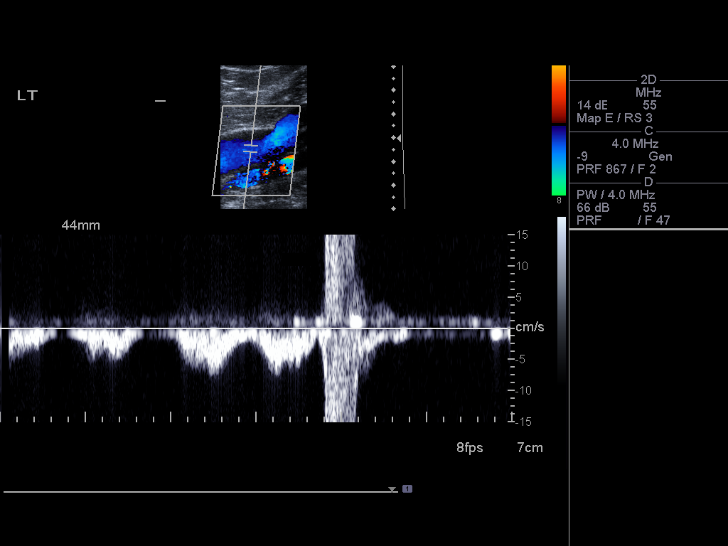
[im 21/49]
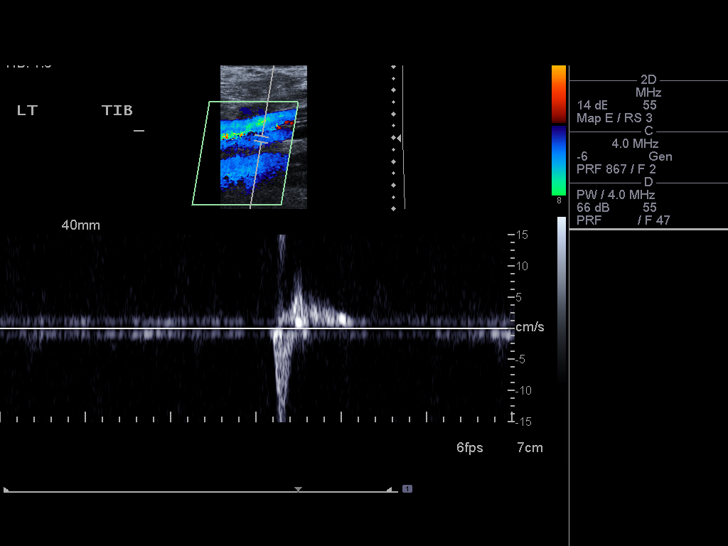
[im 26/49]
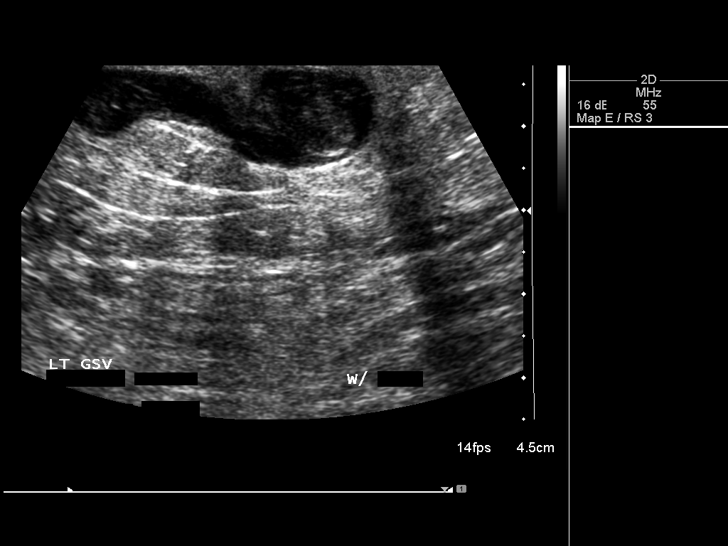
[im 28/49]
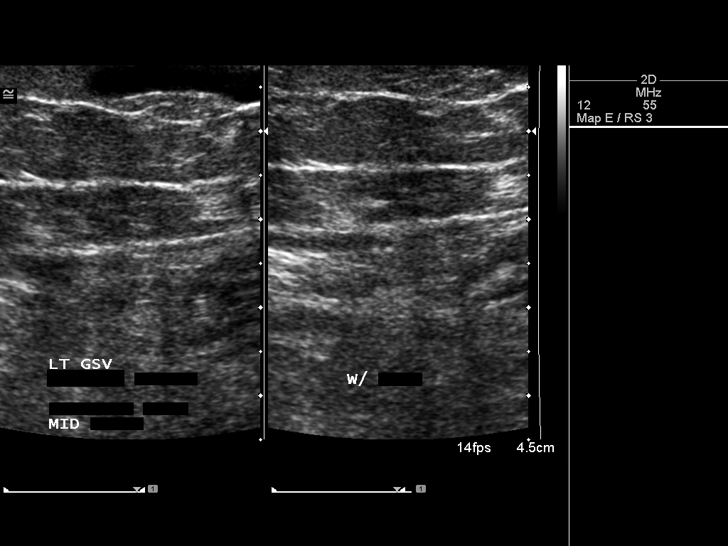
[im 32/49]
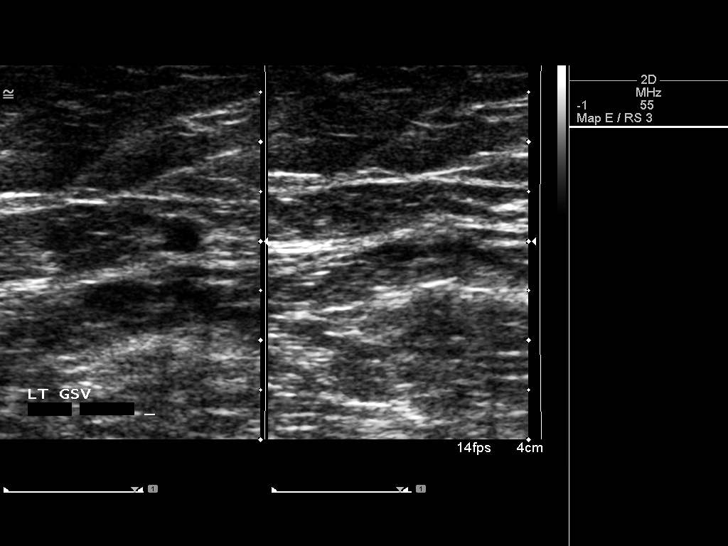
[im 36/49]
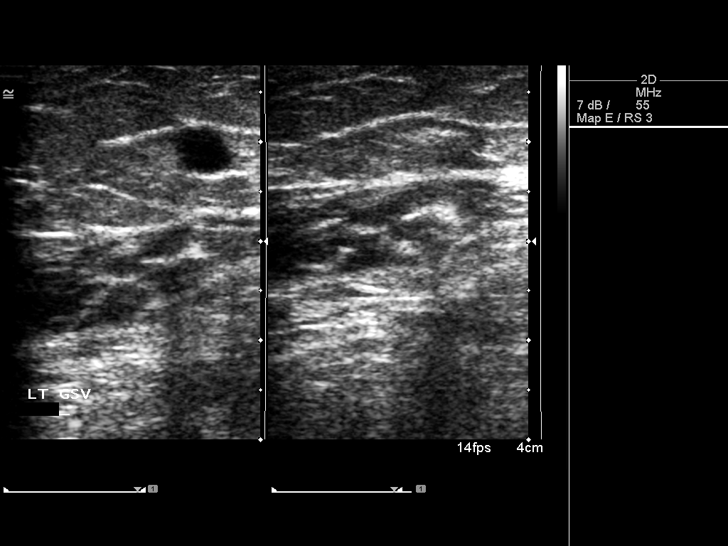
[im 40/49]
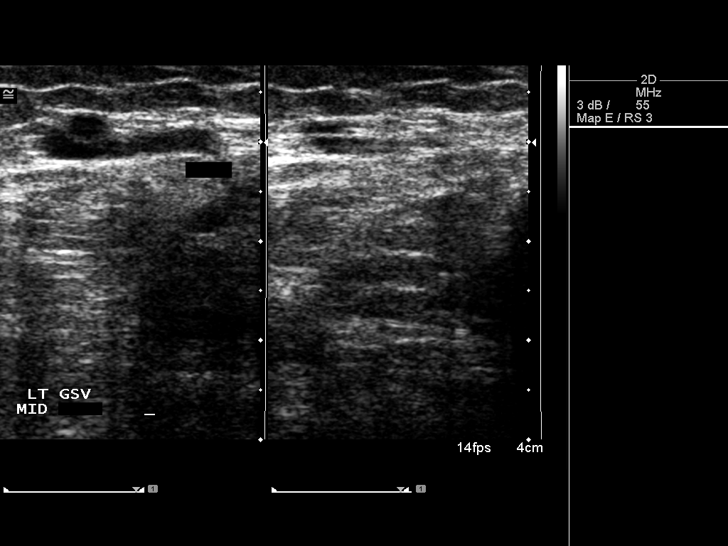
[im 44/49]
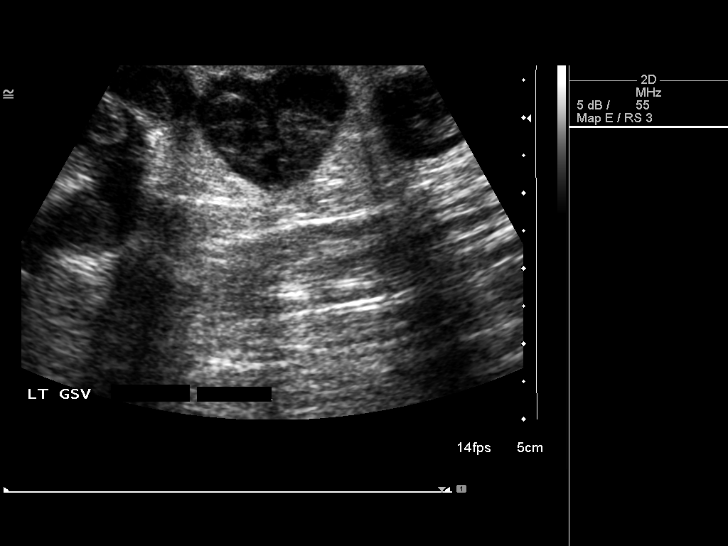
[im 49/49]
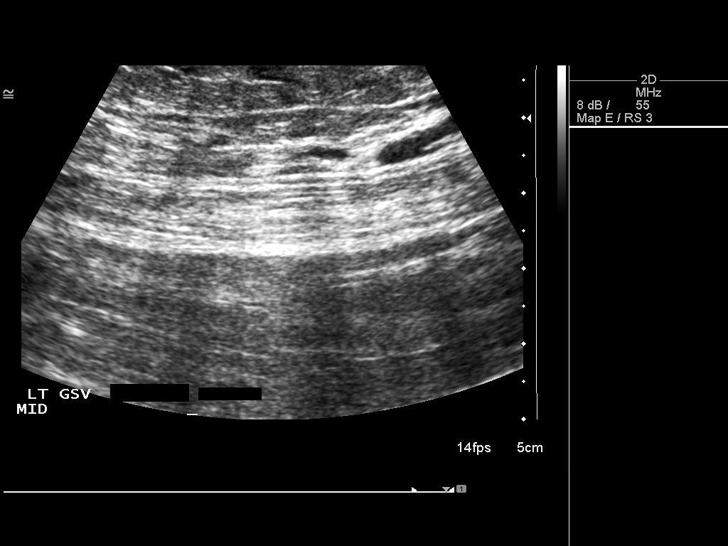

[13 of 24 positions shown; findings below may reference images not displayed]

FINDINGS: Contralateral Common Femoral Vein: Respiratory phasicity is normal
and symmetric with the symptomatic side. No evidence of thrombus.
Normal compressibility.

Common Femoral Vein: No evidence of thrombus. Normal
compressibility, respiratory phasicity and response to augmentation.

Saphenofemoral Junction: No evidence of thrombus. Normal
compressibility and flow on color Doppler imaging.

Profunda Femoral Vein: No evidence of thrombus. Normal
compressibility and flow on color Doppler imaging.

Femoral Vein: No evidence of thrombus. Normal compressibility,
respiratory phasicity and response to augmentation.

Popliteal Vein: No evidence of thrombus. Normal compressibility,
respiratory phasicity and response to augmentation.

Calf Veins: No evidence of thrombus. Normal compressibility and flow
on color Doppler imaging.

Superficial Great Saphenous Vein: No evidence of thrombus. Normal
compressibility and flow on color Doppler imaging.

Venous Reflux:  None.

Other Findings: There are several enlarged thrombosed varicosities
involving the patient's palpable area is heard involving the
proximal thigh (representative images 25 through 27 and 45 through
48).). This prominent varicosity appears to arise from the greater
saphenous vein (representative images 23, 24 and 44) though does not
definitively extend into the greater saphenous vein. The greater
saphenous vein is otherwise patent throughout its imaged course.
IMPRESSION: 1. No evidence of DVT within the left lower extremity.
2. Examination is positive for superficial thrombophlebitis
involving a prominent varicosity at the patient's palpable area
concern involving the anterior thigh. This prominent varicosity
appears to arise from a branch of the greater saphenous vein though
the greater saphenous vein remains patent throughout its imaged
course. Again, there is no extension of this superficial
thrombophlebitis to the deep venous system of the left lower
extremity.

## 2016-12-16 DIAGNOSIS — S83272A Complex tear of lateral meniscus, current injury, left knee, initial encounter: Secondary | ICD-10-CM | POA: Diagnosis not present

## 2016-12-16 DIAGNOSIS — M94262 Chondromalacia, left knee: Secondary | ICD-10-CM | POA: Diagnosis not present

## 2016-12-16 DIAGNOSIS — G8918 Other acute postprocedural pain: Secondary | ICD-10-CM | POA: Diagnosis not present

## 2016-12-16 DIAGNOSIS — M23362 Other meniscus derangements, other lateral meniscus, left knee: Secondary | ICD-10-CM | POA: Diagnosis not present

## 2016-12-16 DIAGNOSIS — M2342 Loose body in knee, left knee: Secondary | ICD-10-CM | POA: Diagnosis not present

## 2016-12-16 DIAGNOSIS — M1712 Unilateral primary osteoarthritis, left knee: Secondary | ICD-10-CM | POA: Diagnosis not present

## 2016-12-22 DIAGNOSIS — M25562 Pain in left knee: Secondary | ICD-10-CM | POA: Diagnosis not present

## 2017-03-09 DIAGNOSIS — M67442 Ganglion, left hand: Secondary | ICD-10-CM | POA: Diagnosis not present

## 2017-03-28 ENCOUNTER — Other Ambulatory Visit: Payer: Self-pay | Admitting: Cardiology

## 2017-03-30 ENCOUNTER — Other Ambulatory Visit: Payer: Self-pay | Admitting: Cardiology

## 2017-07-07 ENCOUNTER — Other Ambulatory Visit: Payer: Self-pay | Admitting: Cardiology

## 2017-07-19 ENCOUNTER — Telehealth: Payer: Self-pay | Admitting: *Deleted

## 2017-07-19 NOTE — Telephone Encounter (Signed)
PA for metoprolol complete and approved until 07-11-18

## 2017-08-15 ENCOUNTER — Encounter (HOSPITAL_BASED_OUTPATIENT_CLINIC_OR_DEPARTMENT_OTHER): Payer: Self-pay | Admitting: Emergency Medicine

## 2017-08-15 ENCOUNTER — Emergency Department (HOSPITAL_BASED_OUTPATIENT_CLINIC_OR_DEPARTMENT_OTHER)
Admission: EM | Admit: 2017-08-15 | Discharge: 2017-08-15 | Disposition: A | Discharge status: Home / Self Care | Payer: 59 | Attending: Emergency Medicine | Admitting: Emergency Medicine

## 2017-08-15 ENCOUNTER — Other Ambulatory Visit: Payer: Self-pay

## 2017-08-15 DIAGNOSIS — Z79899 Other long term (current) drug therapy: Secondary | ICD-10-CM | POA: Diagnosis not present

## 2017-08-15 DIAGNOSIS — I10 Essential (primary) hypertension: Secondary | ICD-10-CM | POA: Diagnosis not present

## 2017-08-15 DIAGNOSIS — M79674 Pain in right toe(s): Secondary | ICD-10-CM | POA: Diagnosis present

## 2017-08-15 DIAGNOSIS — Z87891 Personal history of nicotine dependence: Secondary | ICD-10-CM | POA: Insufficient documentation

## 2017-08-15 DIAGNOSIS — M10071 Idiopathic gout, right ankle and foot: Secondary | ICD-10-CM | POA: Diagnosis not present

## 2017-08-15 MED ORDER — HYDROCODONE-ACETAMINOPHEN 5-325 MG PO TABS
1.0000 | ORAL_TABLET | Freq: Once | ORAL | Status: AC
  Administered 2017-08-15: 1 via ORAL
  Filled 2017-08-15: qty 1

## 2017-08-15 MED ORDER — IBUPROFEN 800 MG PO TABS
800.0000 mg | ORAL_TABLET | Freq: Once | ORAL | Status: AC
  Administered 2017-08-15: 800 mg via ORAL
  Filled 2017-08-15: qty 1

## 2017-08-15 MED ORDER — PREDNISONE 50 MG PO TABS
60.0000 mg | ORAL_TABLET | Freq: Once | ORAL | Status: AC
  Administered 2017-08-15: 08:00:00 60 mg via ORAL
  Filled 2017-08-15: qty 1

## 2017-08-15 MED ORDER — PREDNISONE 10 MG PO TABS: 50.0000 mg | ORAL_TABLET | Freq: Every day | ORAL | 0 refills | Status: AC

## 2017-08-15 MED ORDER — IBUPROFEN 800 MG PO TABS: 800.0000 mg | ORAL_TABLET | Freq: Three times a day (TID) | ORAL | 0 refills | Status: DC | PRN

## 2017-08-15 MED ORDER — HYDROCODONE-ACETAMINOPHEN 5-325 MG PO TABS: 1.0000 | ORAL_TABLET | Freq: Four times a day (QID) | ORAL | 0 refills | Status: DC | PRN

## 2017-08-15 MED FILL — IBUPROFEN 800 MG TAB: 800 | 7 days supply | Qty: 21 | Fill #0

## 2017-08-15 MED FILL — HYDROCODON-APAP 5-325: 5-325 | 1 days supply | Qty: 8 | Fill #0

## 2017-08-15 MED FILL — predniSONE 10 MG TABS: 10 | 4 days supply | Qty: 20 | Fill #0

## 2017-08-15 NOTE — ED Provider Notes (Signed)
[facility] EMERGENCY DEPARTMENT Provider Note   CSN: [id number] Arrival date & time: 08/15/17  0720     History   Chief Complaint Chief Complaint  Patient presents with  . Foot Pain    HPI [name] is a 41 y.o. male.  HPI 41 year old male who presents with right great toe pain.  He has a history of hypertension, paroxysmal atrial fibrillation (no anticoagulation), and nonischemic cardiomyopathy.  States that for the past 4-5 days he has been having pain to the base of the right great toe.  States that he has been out of town, and having a very high fat diet full of red meat and fatty protein and alcohol.  Has not had similar presentation in the past.  No fall or injury.  No fevers or chills, nausea or vomiting.  Pain not well controlled with over-the-counter medications. Past Medical History:  Diagnosis Date  . HTN (hypertension)   . Nonischemic cardiomyopathy (HCC)    EF improved; ? 2/2 ETOH vs. tachy mediated;  Echo 10/2007: EF 55-60%; mild to mod LAE,; trivial MR;    Cath 09/2006: normal cors  . Paroxysmal A-fib [facility])     Patient Active Problem List   Diagnosis Date Noted  . Superficial thrombophlebitis 01/23/2015  . Edema 11/06/2010  . Alcohol use 10/16/2010  . HYPERTENSION, BENIGN 06/30/2008  . CARDIOMYOPATHY, PRIMARY, DILATED 06/30/2008  . ATRIAL FIBRILLATION 06/30/2008    Past Surgical History:  Procedure Laterality Date  . ANTERIOR CRUCIATE LIGAMENT REPAIR    . CARDIAC CATHETERIZATION  10/05/06       Home Medications    Prior to Admission medications   Medication Sig Start Date End Date Taking? Authorizing Provider  furosemide (LASIX) 20 MG tablet TAKE ONE TABLET BY MOUTH ONCE DAILY 03/31/17   Lewayne Bunting, MD  HYDROcodone-acetaminophen (NORCO/VICODIN) 5-325 MG tablet Take 1-2 tablets by mouth every 6 (six) hours as needed. 08/15/17   Lavera Guise, MD  ibuprofen (ADVIL,MOTRIN) 800 MG tablet Take 1 tablet (800 mg total) by mouth every 8  (eight) hours as needed for mild pain or moderate pain. 08/15/17   Lavera Guise, MD  lisinopril (PRINIVIL,ZESTRIL) 20 MG tablet TAKE ONE TABLET BY MOUTH ONCE DAILY. MUST HAVE OFFICE VISIT BEFORE FURTHER REFILLS. 07/07/17   Lewayne Bunting, MD  metoprolol succinate (TOPROL-XL) 100 MG 24 hr tablet TAKE ONE TABLET BY MOUTH TWICE DAILY 03/28/17   Lewayne Bunting, MD  potassium chloride (K-DUR) 10 MEQ tablet TAKE ONE TABLET BY MOUTH ONCE DAILY 07/07/17   Lewayne Bunting, MD  predniSONE (DELTASONE) 10 MG tablet Take 5 tablets (50 mg total) by mouth daily for 4 days. 08/16/17 08/20/17  Lavera Guise, MD    Family History Family History  Problem Relation Age of Onset  . Hypertension Father   . Alcohol abuse Father   . Alcohol abuse Mother     Social History Social History   Tobacco Use  . Smoking status: Former Smoker    Packs/day: 1.00    Years: 10.00    Pack years: 10.00    Last attempt to quit: 01/23/2004    Years since quitting: 13.5  . Smokeless tobacco: Never Used  . Tobacco comment: quit appox 12 yrs ago  Substance Use Topics  . Alcohol use: Yes    Alcohol/week: 7.2 oz    Types: 12 Cans of beer per week  . Drug use: No     Allergies   Patient has  no known allergies.   Review of Systems Review of Systems  Constitutional: Negative for fever.  Gastrointestinal: Negative for nausea and vomiting.  Musculoskeletal:       Right great toe pain   Skin: Negative for wound.  Allergic/Immunologic: Negative for immunocompromised state.  Hematological: Does not bruise/bleed easily.     Physical Exam Updated Vital Signs BP (!) 139/99 (BP Location: Right Arm)   Pulse 70   Temp 98.3 F (36.8 C) (Oral)   Resp 18   Ht 6' (1.829 m)   Wt (!) 142 kg (313 lb)   SpO2 100%   BMI 42.45 kg/m   Physical Exam Physical Exam  Constitutional: Appears well-developed and well-nourished. No acute distress. HENT:  Head: Normocephalic.  Eyes: Conjunctivae are normal.  Neck:  Supple, normal range of motion Cardiovascular: Normal rate and intact distal pulses.  +2 DP pulses Pulmonary/Chest: Effort normal. No respiratory distress.  Abdominal: Exhibits no distension.  Musculoskeletal: Normal range of motion of right lower extremities without deformities.  There is mild swelling and erythema overlying the MTP joint of the right great toe. Neurological: Alert. Fluent speech.  Full strength and sensation remedies Skin: Skin is warm and dry.  Psychiatric: Normal mood and affect. Behavior is normal.  Nursing note and vitals reviewed.   ED Treatments / Results  Labs (all labs ordered are listed, but only abnormal results are displayed) Labs Reviewed - No data to display  EKG  EKG Interpretation None       Radiology No results found.  Procedures Procedures (including critical care time)  Medications Ordered in ED Medications  HYDROcodone-acetaminophen (NORCO/VICODIN) 5-325 MG per tablet 1 tablet (not administered)  ibuprofen (ADVIL,MOTRIN) tablet 800 mg (not administered)  predniSONE (DELTASONE) tablet 60 mg (not administered)     Initial Impression / Assessment and Plan / ED Course  I have reviewed the triage vital signs and the nursing notes.  Pertinent labs & imaging results that were available during my care of the patient were reviewed by me and considered in my medical decision making (see chart for details).     Presentation is consistent with gout.  This is involving the right great MTP joint.  Doubt infectious cause such as septic arthritis.  No history of trauma.  Will start on steroids, anti-inflammatory medications and Norco for breakthrough pain. Strict return and follow-up instructions reviewed. He expressed understanding of all discharge instructions and felt comfortable with the plan of care.   Final Clinical Impressions(s) / ED Diagnoses   Final diagnoses:  Acute idiopathic gout involving toe of right foot    ED Discharge Orders         Ordered    predniSONE (DELTASONE) 10 MG tablet  Daily     08/15/17 0759    HYDROcodone-acetaminophen (NORCO/VICODIN) 5-325 MG tablet  Every 6 hours PRN     08/15/17 0759    ibuprofen (ADVIL,MOTRIN) 800 MG tablet  Every 8 hours PRN     08/15/17 0759       Lavera GuiseLiu, Jameica Couts Duo, MD 08/15/17 [phone]

## 2017-08-15 NOTE — Discharge Instructions (Signed)
Your findings are consistent with gout. You are provided a diet plan with your paperwork. Please continue antiinflammatory medications including NSAIDs, steroids. Norco written for breakthrough pain. Return for worsening symptoms, including fever, escalating pain, inability to walk or any other symptoms concerning to you

## 2017-08-15 NOTE — ED Triage Notes (Signed)
Patient states that he woke up with pain to his right toe and joint

## 2017-09-30 ENCOUNTER — Other Ambulatory Visit: Payer: Self-pay | Admitting: Cardiology

## 2017-11-04 DIAGNOSIS — M109 Gout, unspecified: Secondary | ICD-10-CM | POA: Diagnosis not present

## 2017-12-26 ENCOUNTER — Other Ambulatory Visit: Payer: Self-pay | Admitting: Cardiology

## 2017-12-27 NOTE — Telephone Encounter (Signed)
REFILL 

## 2018-01-18 DIAGNOSIS — H60339 Swimmer's ear, unspecified ear: Secondary | ICD-10-CM | POA: Diagnosis not present

## 2018-01-30 ENCOUNTER — Other Ambulatory Visit: Payer: Self-pay | Admitting: Cardiology

## 2018-02-20 DIAGNOSIS — D229 Melanocytic nevi, unspecified: Secondary | ICD-10-CM | POA: Diagnosis not present

## 2018-02-20 DIAGNOSIS — L409 Psoriasis, unspecified: Secondary | ICD-10-CM | POA: Diagnosis not present

## 2018-03-05 ENCOUNTER — Other Ambulatory Visit: Payer: Self-pay | Admitting: Cardiology

## 2018-03-17 DIAGNOSIS — M109 Gout, unspecified: Secondary | ICD-10-CM | POA: Diagnosis not present

## 2018-03-29 IMAGING — US US EXTREM LOW VENOUS*R*
1 series · 13 of 24 positions shown · non-contrast
Comparison: None.

CLINICAL DATA: Right calf pain and tenderness. History of varicose
veins.



[Series 1: us extrem low venous*right* · 13 of 49 slices shown]
[im 1/49]
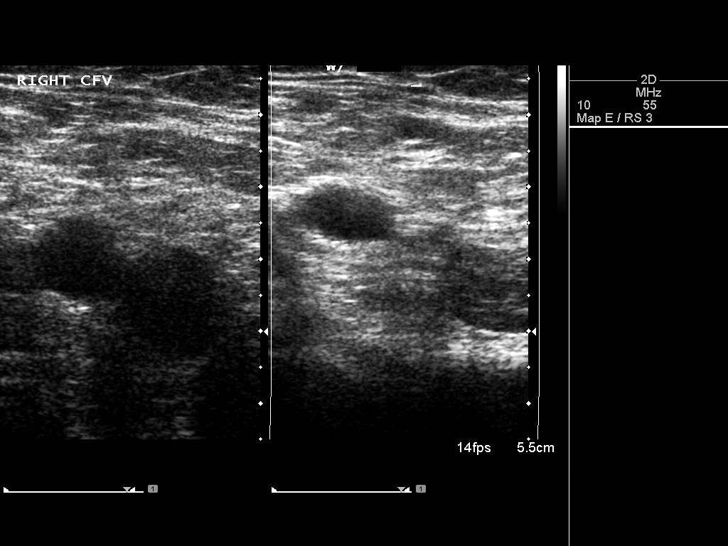
[im 5/49]
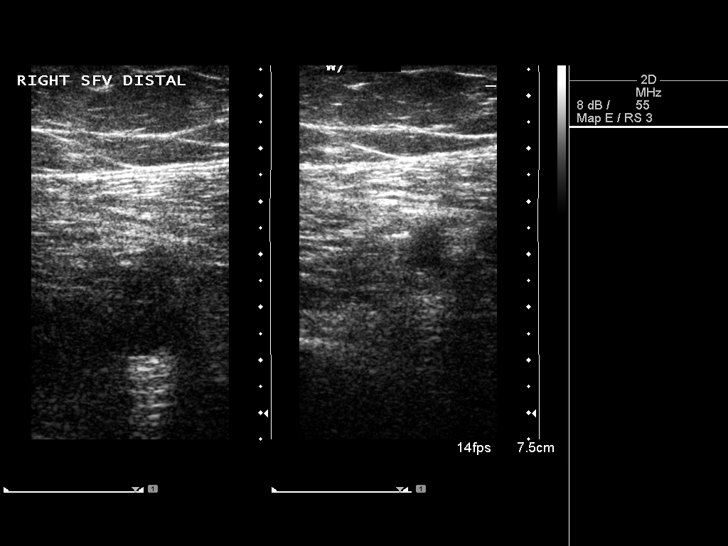
[im 9/49]
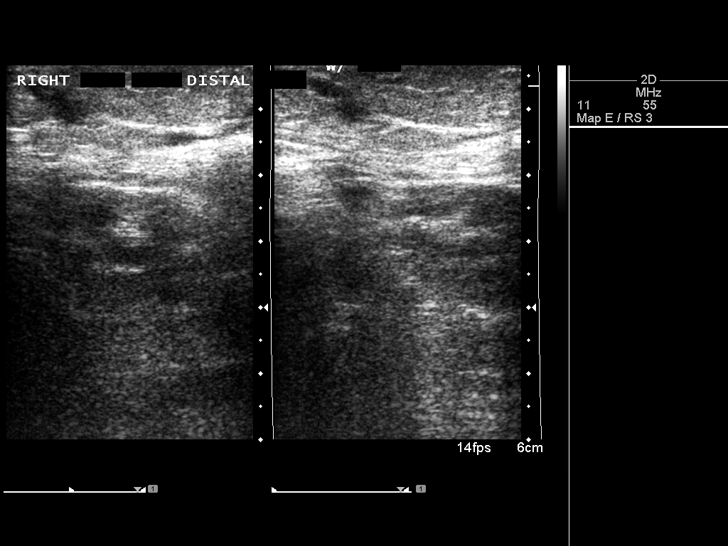
[im 13/49]
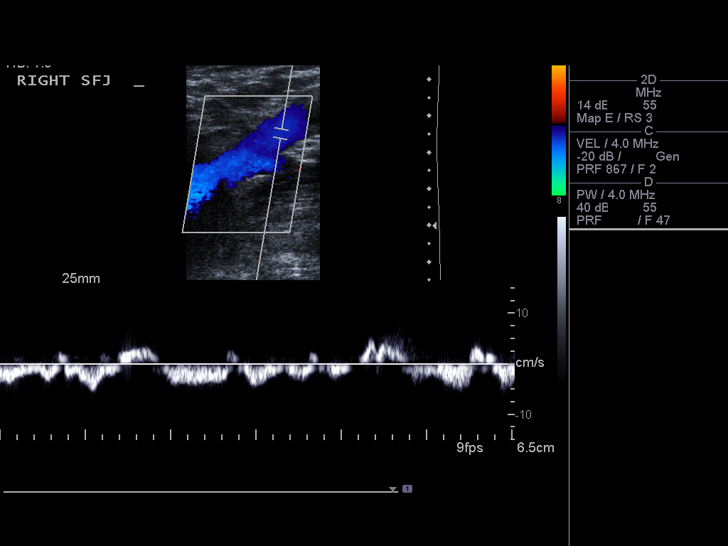
[im 17/49]
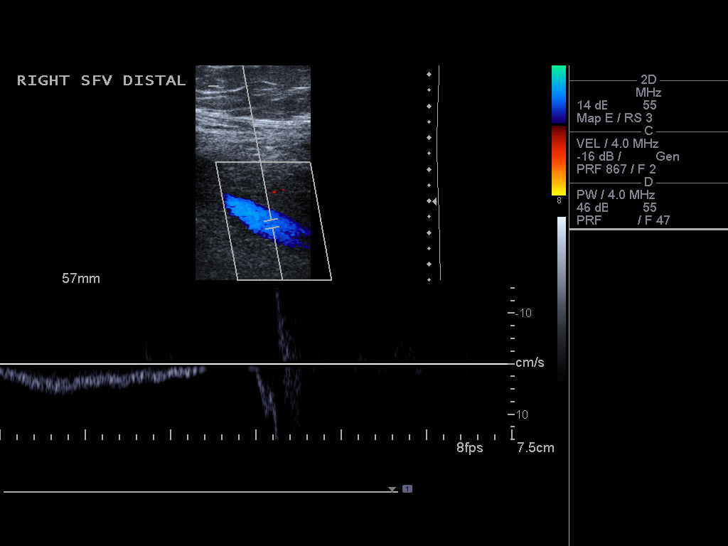
[im 21/49]
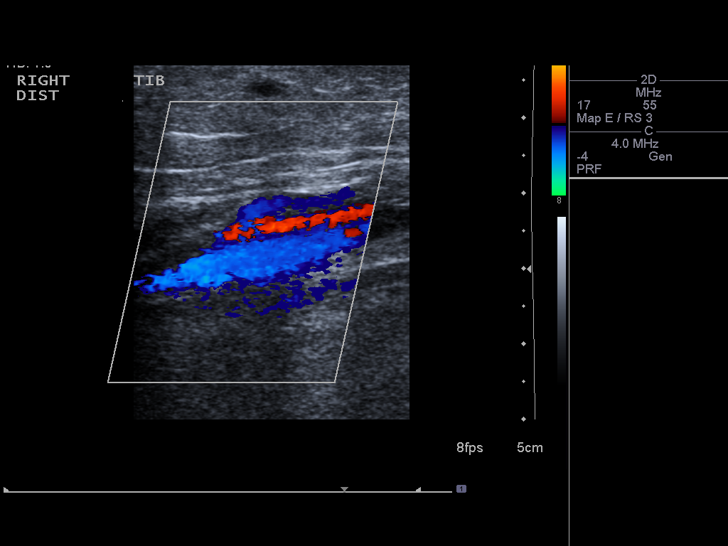
[im 26/49]
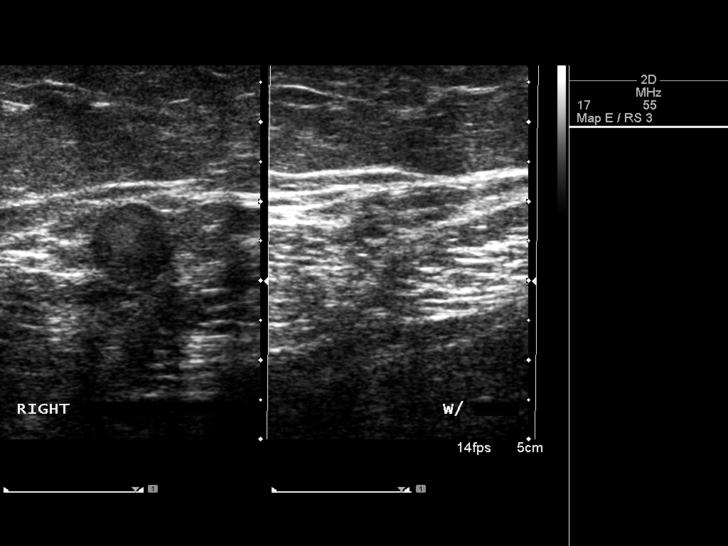
[im 28/49]
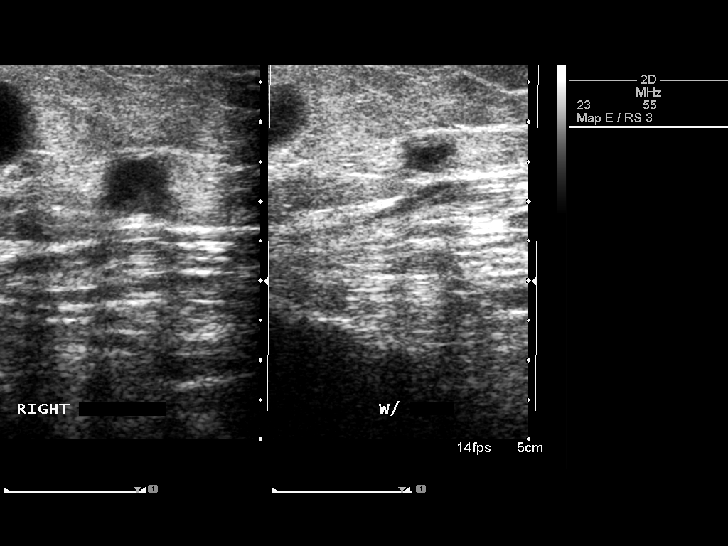
[im 32/49]
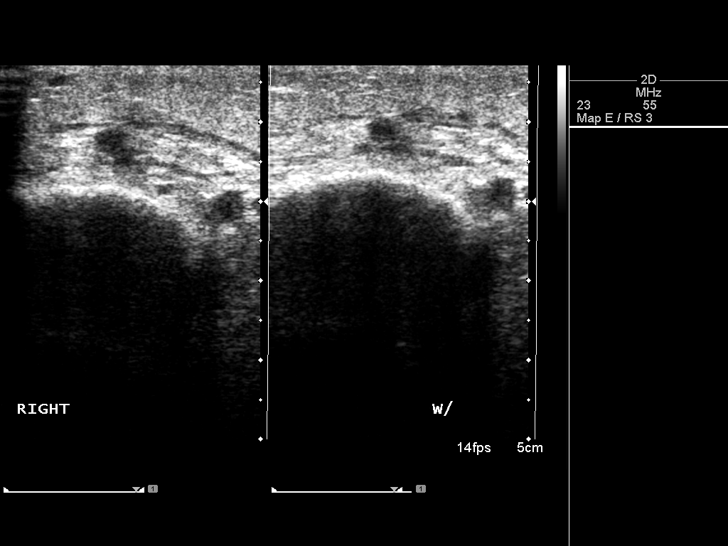
[im 36/49]
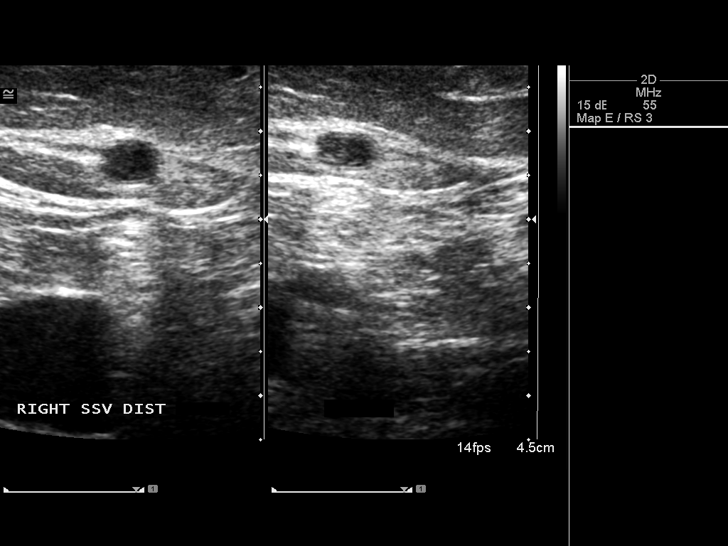
[im 40/49]
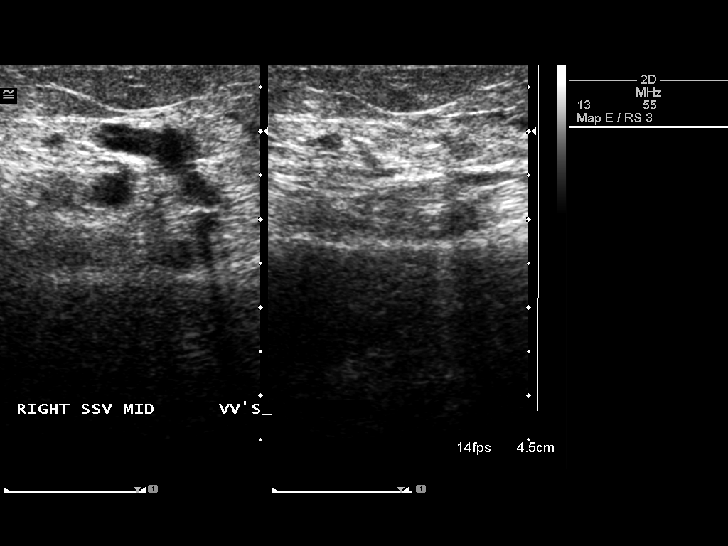
[im 44/49]
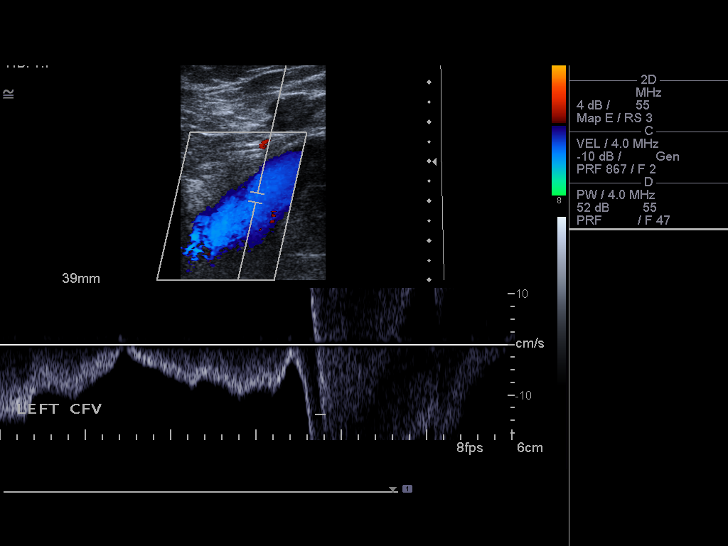
[im 49/49]
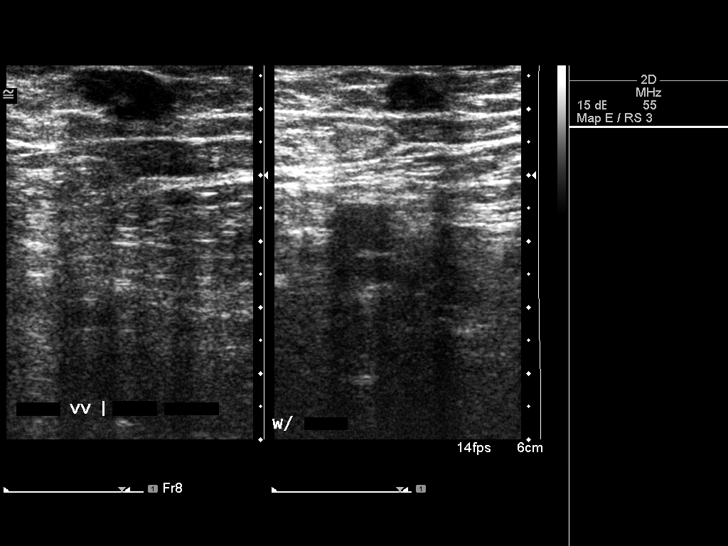

[13 of 24 positions shown; findings below may reference images not displayed]

FINDINGS: Contralateral Common Femoral Vein: Respiratory phasicity is normal
and symmetric with the symptomatic side. No evidence of thrombus.
Normal compressibility.

Common Femoral Vein: No evidence of thrombus. Normal
compressibility, respiratory phasicity and response to augmentation.

Saphenofemoral Junction: No evidence of thrombus. Normal
compressibility and flow on color Doppler imaging.

Profunda Femoral Vein: No evidence of thrombus. Normal
compressibility and flow on color Doppler imaging.

Femoral Vein: No evidence of thrombus. Normal compressibility,
respiratory phasicity and response to augmentation.

Popliteal Vein: No evidence of thrombus. Normal compressibility,
respiratory phasicity and response to augmentation.

Calf Veins: No evidence of thrombus. Normal compressibility and flow
on color Doppler imaging.

Superficial Great Saphenous Vein: There is occlusive thrombus within
the right great saphenous vein extending from the knee to the ankle.
Multiple communicating varicosities also show thrombosis in the
thigh and calf. The short saphenous vein behind the knee is also
thrombosed.

Venous Reflux:  None.

Other Findings: Examination of the contralateral left thigh
demonstrated at least one thrombosed varicosity in the proximal left
thigh.
IMPRESSION: 1. Superficial thrombophlebitis involving the right great saphenous
vein as well as multiple communicating varicosities in the thigh and
calf. No evidence of right lower extremity DVT.
2. Incidental thrombophlebitis of at least 1 varicosity in the
proximal left thigh.

## 2018-04-03 DIAGNOSIS — L409 Psoriasis, unspecified: Secondary | ICD-10-CM | POA: Diagnosis not present

## 2018-04-10 ENCOUNTER — Ambulatory Visit: Payer: 59 | Admitting: Cardiology

## 2018-04-11 ENCOUNTER — Ambulatory Visit: Payer: 59 | Admitting: Cardiology

## 2018-04-11 ENCOUNTER — Encounter: Payer: Self-pay | Admitting: Cardiology

## 2018-04-11 VITALS — BP 122/78 | HR 58 | Ht 72.0 in | Wt 309.8 lb

## 2018-04-11 DIAGNOSIS — I480 Paroxysmal atrial fibrillation: Secondary | ICD-10-CM | POA: Diagnosis not present

## 2018-04-11 DIAGNOSIS — I10 Essential (primary) hypertension: Secondary | ICD-10-CM | POA: Diagnosis not present

## 2018-04-11 DIAGNOSIS — Z789 Other specified health status: Secondary | ICD-10-CM | POA: Diagnosis not present

## 2018-04-11 DIAGNOSIS — Z8739 Personal history of other diseases of the musculoskeletal system and connective tissue: Secondary | ICD-10-CM

## 2018-04-11 DIAGNOSIS — E6609 Other obesity due to excess calories: Secondary | ICD-10-CM | POA: Insufficient documentation

## 2018-04-11 DIAGNOSIS — Z7289 Other problems related to lifestyle: Secondary | ICD-10-CM

## 2018-04-11 LAB — BASIC METABOLIC PANEL
BUN/Creatinine Ratio: 14 (ref 9–20)
BUN: 10 mg/dL (ref 6–24)
CO2: 25 mmol/L (ref 20–29)
Calcium: 9.5 mg/dL (ref 8.7–10.2)
Chloride: 98 mmol/L (ref 96–106)
Creatinine, Ser: 0.71 mg/dL — ABNORMAL LOW (ref 0.76–1.27)
GFR calc Af Amer: 134 mL/min/{1.73_m2} (ref >59)
GFR calc non Af Amer: 116 mL/min/{1.73_m2} (ref >59)
Glucose: 85 mg/dL (ref 65–99)
Potassium: 4.8 mmol/L (ref 3.5–5.2)
Sodium: 136 mmol/L (ref 134–144)

## 2018-04-11 MED ORDER — FUROSEMIDE 20 MG PO TABS: ORAL_TABLET | ORAL | 2 refills | Status: DC

## 2018-04-11 MED ORDER — METOPROLOL SUCCINATE ER 100 MG PO TB24: 100.0000 mg | ORAL_TABLET | Freq: Two times a day (BID) | ORAL | 3 refills | Status: DC

## 2018-04-11 MED ORDER — POTASSIUM CHLORIDE ER 10 MEQ PO TBCR: 10.0000 meq | EXTENDED_RELEASE_TABLET | Freq: Every day | ORAL | 3 refills | Status: DC

## 2018-04-11 MED ORDER — LISINOPRIL 20 MG PO TABS: ORAL_TABLET | ORAL | 3 refills | Status: DC

## 2018-04-11 NOTE — Assessment & Plan Note (Signed)
Two gout attacks since LOV

## 2018-04-11 NOTE — Patient Instructions (Signed)
Medication Instructions:  No changes.  Refills sent to pharmacy  If you need a refill on your cardiac medications before your next appointment, please call your pharmacy.  Labwork: BMP HERE IN OUR OFFICE AT [facility]  Testing/Procedures: None Ordered  Special Instructions: None Ordered  Follow-Up: Your physician wants you to follow-up in: One year with Dr. Jens Som. You should receive a reminder letter in the mail two months in advance.   Thank you for choosing [facility] at [facility]!!

## 2018-04-11 NOTE — Assessment & Plan Note (Signed)
BMI 42- he snores but no other symptoms of sleep apnea

## 2018-04-11 NOTE — Assessment & Plan Note (Signed)
Binge ETOH use

## 2018-04-11 NOTE — Assessment & Plan Note (Signed)
B/P at home 130's, B/P by me 108/60. No symptoms to suggest hypotension.

## 2018-04-11 NOTE — Progress Notes (Signed)
04/11/2018 [name]   1976/06/28  [id number]  Primary Physician Marden Noble, MD Primary Cardiologist: Dr Jens Som  HPI:  Pleasant 42 y/o obese married male, works as a Probation officer, with a history of past PAF felt to be related to ETOH. His last echo in 2015 showed normal LVF. He says he has remained in NSR except for an episode where he got off his Toprol, he had recurrent AF which resolved when Toprol was resumed. He is in the office today for follow up and to get his prescriptions refilled. He dnies any complaints of dyspnea, orthopnea, or palpitations.    Current Outpatient Medications  Medication Sig Dispense Refill  . furosemide (LASIX) 20 MG tablet TAKE 1 TABLET BY MOUTH ONCE DAILY 90 tablet 2  . lisinopril (PRINIVIL,ZESTRIL) 20 MG tablet TAKE ONE TABLET BY MOUTH ONCE DAILY. 90 tablet 3  . metoprolol succinate (TOPROL-XL) 100 MG 24 hr tablet Take 1 tablet (100 mg total) by mouth 2 (two) times daily. Take with or immediately following a meal. 180 tablet 3  . potassium chloride (K-DUR) 10 MEQ tablet Take 1 tablet (10 mEq total) by mouth daily. 90 tablet 3   No current facility-administered medications for this visit.     No Known Allergies  Past Medical History:  Diagnosis Date  . HTN (hypertension)   . Nonischemic cardiomyopathy (HCC)    EF improved; ? 2/2 ETOH vs. tachy mediated;  Echo 10/2007: EF 55-60%; mild to mod LAE,; trivial MR;    Cath 09/2006: normal cors  . Paroxysmal A-fib [facility])     Social History   Socioeconomic History  . Marital status: Married    Spouse name: Not on file  . Number of children: Not on file  . Years of education: Not on file  . Highest education level: Not on file  Occupational History  . Not on file  Social Needs  . Financial resource strain: Not on file  . Food insecurity:    Worry: Not on file    Inability: Not on file  . Transportation needs:    Medical: Not on file    Non-medical: Not on file   Tobacco Use  . Smoking status: Former Smoker    Packs/day: 1.00    Years: 10.00    Pack years: 10.00    Last attempt to quit: 01/23/2004    Years since quitting: 14.2  . Smokeless tobacco: Never Used  . Tobacco comment: quit appox 12 yrs ago  Substance and Sexual Activity  . Alcohol use: Yes    Alcohol/week: 12.0 standard drinks    Types: 12 Cans of beer per week  . Drug use: No  . Sexual activity: Not on file  Lifestyle  . Physical activity:    Days per week: Not on file    Minutes per session: Not on file  . Stress: Not on file  Relationships  . Social connections:    Talks on phone: Not on file    Gets together: Not on file    Attends religious service: Not on file    Active member of club or organization: Not on file    Attends meetings of clubs or organizations: Not on file    Relationship status: Not on file  . Intimate partner violence:    Fear of current or ex partner: Not on file    Emotionally abused: Not on file    Physically abused: Not on file    Forced  sexual activity: Not on file  Other Topics Concern  . Not on file  Social History Narrative  . Not on file     Family History  Problem Relation Age of Onset  . Hypertension Father   . Alcohol abuse Father   . Alcohol abuse Mother      Review of Systems: General: negative for chills, fever, night sweats or weight changes.  Cardiovascular: negative for chest pain, dyspnea on exertion, edema, orthopnea, palpitations, paroxysmal nocturnal dyspnea or shortness of breath Dermatological: negative for rash Respiratory: negative for cough or wheezing Urologic: negative for hematuria Abdominal: negative for nausea, vomiting, diarrhea, bright red blood per rectum, melena, or hematemesis Neurologic: negative for visual changes, syncope, or dizziness Two episodes of gout in the past year Pt snores but feel that he rest well All other systems reviewed and are otherwise negative except as noted  above.    Blood pressure 122/78, pulse (!) 58, height 6' (1.829 m), weight (!) 309 lb 12.8 oz (140.5 kg).  General appearance: alert, cooperative, no distress and morbidly obese Neck: no carotid bruit and no JVD Lungs: clear to auscultation bilaterally Heart: regular rate and rhythm Extremities: no edema Skin: Skin color, texture, turgor normal. No rashes or lesions Neurologic: Grossly normal  EKG NSR, SB-58  ASSESSMENT AND PLAN:   Essential hypertension B/P at home 130's, B/P by me 108/60. No symptoms to suggest hypotension.   Alcohol use Binge ETOH use  Obesity due to excess calories BMI 42- he snores but no other symptoms of sleep apnea  History of gout Two gout attacks since LOV   PLAN  We discussed the importance of weight loss and how ETOH excess can contribute to obesity, HTN, gout, sleep apnea, and PAF. I don't think he has sleep apnea by his history but we discussed the signs and symptoms of this. I did not change his medications. Check BMP today. F/U in one year with Dr Jens Som.   Corine Shelter PA-C 04/11/2018 10:19 AM

## 2018-06-07 DIAGNOSIS — I10 Essential (primary) hypertension: Secondary | ICD-10-CM | POA: Diagnosis not present

## 2018-06-07 DIAGNOSIS — M109 Gout, unspecified: Secondary | ICD-10-CM | POA: Diagnosis not present

## 2018-08-01 ENCOUNTER — Telehealth: Payer: Self-pay | Admitting: Cardiology

## 2018-08-01 NOTE — Telephone Encounter (Signed)
Spoke with [employer], the PA was approved for metoprolol today. Patient made aware.

## 2018-08-01 NOTE — Telephone Encounter (Signed)
New Message:    Please call, says he is having a problem getting his Metoprolol.

## 2018-09-05 DIAGNOSIS — M109 Gout, unspecified: Secondary | ICD-10-CM | POA: Diagnosis not present

## 2018-10-16 DIAGNOSIS — J101 Influenza due to other identified influenza virus with other respiratory manifestations: Secondary | ICD-10-CM | POA: Diagnosis not present

## 2018-10-18 ENCOUNTER — Ambulatory Visit (INDEPENDENT_AMBULATORY_CARE_PROVIDER_SITE_OTHER)
Admission: EM | Admit: 2018-10-18 | Discharge: 2018-10-18 | Disposition: A | Discharge status: Home / Self Care | Payer: No Typology Code available for payment source | Source: Home / Self Care

## 2018-10-18 ENCOUNTER — Emergency Department (HOSPITAL_BASED_OUTPATIENT_CLINIC_OR_DEPARTMENT_OTHER): Payer: No Typology Code available for payment source

## 2018-10-18 ENCOUNTER — Emergency Department (HOSPITAL_BASED_OUTPATIENT_CLINIC_OR_DEPARTMENT_OTHER)
Admission: EM | Admit: 2018-10-18 | Discharge: 2018-10-18 | Disposition: A | Discharge status: Home / Self Care | Payer: No Typology Code available for payment source | Attending: Emergency Medicine | Admitting: Emergency Medicine

## 2018-10-18 ENCOUNTER — Other Ambulatory Visit: Payer: Self-pay

## 2018-10-18 ENCOUNTER — Encounter (HOSPITAL_BASED_OUTPATIENT_CLINIC_OR_DEPARTMENT_OTHER): Payer: Self-pay

## 2018-10-18 DIAGNOSIS — I10 Essential (primary) hypertension: Secondary | ICD-10-CM | POA: Diagnosis not present

## 2018-10-18 DIAGNOSIS — M7989 Other specified soft tissue disorders: Secondary | ICD-10-CM | POA: Diagnosis not present

## 2018-10-18 DIAGNOSIS — Z87891 Personal history of nicotine dependence: Secondary | ICD-10-CM | POA: Insufficient documentation

## 2018-10-18 DIAGNOSIS — L03115 Cellulitis of right lower limb: Secondary | ICD-10-CM

## 2018-10-18 DIAGNOSIS — I82890 Acute embolism and thrombosis of other specified veins: Secondary | ICD-10-CM

## 2018-10-18 DIAGNOSIS — I82811 Embolism and thrombosis of superficial veins of right lower extremities: Secondary | ICD-10-CM | POA: Diagnosis not present

## 2018-10-18 DIAGNOSIS — Z79899 Other long term (current) drug therapy: Secondary | ICD-10-CM | POA: Insufficient documentation

## 2018-10-18 DIAGNOSIS — M79604 Pain in right leg: Secondary | ICD-10-CM | POA: Diagnosis not present

## 2018-10-18 DIAGNOSIS — I82401 Acute embolism and thrombosis of unspecified deep veins of right lower extremity: Secondary | ICD-10-CM

## 2018-10-18 DIAGNOSIS — I82491 Acute embolism and thrombosis of other specified deep vein of right lower extremity: Secondary | ICD-10-CM | POA: Insufficient documentation

## 2018-10-18 LAB — BASIC METABOLIC PANEL
Anion gap: 8 (ref 5–15)
BUN: 14 mg/dL (ref 6–20)
CO2: 25 mmol/L (ref 22–32)
CREATININE: 1 mg/dL (ref 0.61–1.24)
Calcium: 8.7 mg/dL — ABNORMAL LOW (ref 8.9–10.3)
Chloride: 98 mmol/L (ref 98–111)
GFR calc Af Amer: >60 mL/min (ref >60)
GFR calc non Af Amer: >60 mL/min (ref >60)
Glucose, Bld: 119 mg/dL — ABNORMAL HIGH (ref 70–99)
Potassium: 3.4 mmol/L — ABNORMAL LOW (ref 3.5–5.1)
Sodium: 131 mmol/L — ABNORMAL LOW (ref 135–145)

## 2018-10-18 LAB — CBC WITH DIFFERENTIAL/PLATELET
Abs Immature Granulocytes: 0.03 10*3/uL (ref 0.00–0.07)
BASOS ABS: 0 10*3/uL (ref 0.0–0.1)
Basophils Relative: 0 %
Eosinophils Absolute: 0 10*3/uL (ref 0.0–0.5)
Eosinophils Relative: 0 %
HCT: 46.3 % (ref 39.0–52.0)
Hemoglobin: 14.9 g/dL (ref 13.0–17.0)
Immature Granulocytes: 0 %
LYMPHS ABS: 1 10*3/uL (ref 0.7–4.0)
Lymphocytes Relative: 9 %
MCH: 29.9 pg (ref 26.0–34.0)
MCHC: 32.2 g/dL (ref 30.0–36.0)
MCV: 93 fL (ref 80.0–100.0)
Monocytes Absolute: 0.9 10*3/uL (ref 0.1–1.0)
Monocytes Relative: 9 %
Neutro Abs: 8.3 10*3/uL — ABNORMAL HIGH (ref 1.7–7.7)
Neutrophils Relative %: 82 %
Platelets: 218 10*3/uL (ref 150–400)
RBC: 4.98 MIL/uL (ref 4.22–5.81)
RDW: 12.5 % (ref 11.5–15.5)
WBC: 10.2 10*3/uL (ref 4.0–10.5)
nRBC: 0 % (ref 0.0–0.2)

## 2018-10-18 MED ORDER — RIVAROXABAN (XARELTO) EDUCATION KIT FOR DVT/PE PATIENTS
PACK | Freq: Once | Status: AC
  Administered 2018-10-18: 21:00:00

## 2018-10-18 MED ORDER — RIVAROXABAN 15 MG PO TABS
15.0000 mg | ORAL_TABLET | Freq: Once | ORAL | Status: AC
  Administered 2018-10-18: 15 mg via ORAL
  Filled 2018-10-18: qty 1

## 2018-10-18 MED ORDER — RIVAROXABAN (XARELTO) VTE STARTER PACK (15 & 20 MG): ORAL_TABLET | ORAL | 0 refills | Status: DC

## 2018-10-18 MED ORDER — CLINDAMYCIN HCL 150 MG PO CAPS: 450.0000 mg | ORAL_CAPSULE | Freq: Three times a day (TID) | ORAL | 0 refills | Status: AC

## 2018-10-18 MED ORDER — CLINDAMYCIN HCL 150 MG PO CAPS: 450.0000 mg | ORAL_CAPSULE | Freq: Three times a day (TID) | ORAL | 0 refills | Status: DC

## 2018-10-18 MED ORDER — CLINDAMYCIN PHOSPHATE 600 MG/50ML IV SOLN
600.0000 mg | Freq: Once | INTRAVENOUS | Status: AC
  Administered 2018-10-18: 600 mg via INTRAVENOUS
  Filled 2018-10-18: qty 50

## 2018-10-18 NOTE — ED Provider Notes (Signed)
[facility] EMERGENCY DEPARTMENT Provider Note   CSN: [id number] Arrival date & time: 10/18/18  1823    History   Chief Complaint Chief Complaint  Patient presents with  . Leg Pain    HPI [name] is a 43 y.o. male a past medical history of paroxysmal A. fib, hypertension, nonischemic cardiomyopathy, who presents today for evaluation of right leg pain and swelling.  He has chronic psoriasis and has multiple open wounds on his right lower leg.  He reports that normally his right leg is not swollen below the ankle when compared to the left.  He was diagnosed with the flu on Monday by flu swab states he was tested positive for influenza A, and has been taking Tamiflu.  He reports that he feels like his fever broke however he has been feeling poor again.  He denies any chest pain or shortness of breath.  He has a history of superficial thrombophlebitis however does not have a history of DVT.  He does not take any blood thinning medications.  His leg pain and swelling has been worsening for 3 days.  He reports that due to influenza he has been laying in bed since Monday.  He has been unable to bear his full weight on his right leg, has been using crutches.      HPI  Past Medical History:  Diagnosis Date  . HTN (hypertension)   . Nonischemic cardiomyopathy ([place])    EF improved; ? 2/2 ETOH vs. tachy mediated;  Echo 10/2007: EF 55-60%; mild to mod LAE,; trivial MR;    Cath 09/2006: normal cors  . Paroxysmal A-fib [facility])     Patient Active Problem List   Diagnosis Date Noted  . Obesity due to excess calories 04/11/2018  . History of gout 04/11/2018  . Superficial thrombophlebitis 01/23/2015  . Edema 11/06/2010  . Alcohol use 10/16/2010  . Essential hypertension 06/30/2008  . CARDIOMYOPATHY, PRIMARY, DILATED 06/30/2008  . PAF (paroxysmal atrial fibrillation) ([place]) 06/30/2008    Past Surgical History:  Procedure Laterality Date  . ANTERIOR CRUCIATE LIGAMENT REPAIR      . CARDIAC CATHETERIZATION  10/05/06        Home Medications    Prior to Admission medications   Medication Sig Start Date End Date Taking? Authorizing Provider  allopurinol (ZYLOPRIM) 100 MG tablet Take 100 mg by mouth daily.   Yes [provider]  clindamycin (CLEOCIN) 150 MG capsule Take 3 capsules (450 mg total) by mouth 3 (three) times daily for 7 days. 10/18/18 10/25/18  Lorin Glass, PA-C  furosemide (LASIX) 20 MG tablet TAKE 1 TABLET BY MOUTH ONCE DAILY 04/11/18   Kerin Ransom K, PA-C  lisinopril (PRINIVIL,ZESTRIL) 20 MG tablet TAKE ONE TABLET BY MOUTH ONCE DAILY. 04/11/18   Erlene Quan, PA-C  metoprolol succinate (TOPROL-XL) 100 MG 24 hr tablet Take 1 tablet (100 mg total) by mouth 2 (two) times daily. Take with or immediately following a meal. 04/11/18   Kilroy, Doreene Burke, PA-C  potassium chloride (K-DUR) 10 MEQ tablet Take 1 tablet (10 mEq total) by mouth daily. 04/11/18   Erlene Quan, PA-C  Rivaroxaban 15 & 20 MG TBPK Take as directed on package: Start with one 59m tablet by mouth twice a day with food. On Day 22, switch to one 259mtablet once a day. 10/18/18   HaLorin GlassPA-C    Family History Family History  Problem Relation Age of Onset  . Hypertension Father   .  Alcohol abuse Father   . Alcohol abuse Mother     Social History Social History   Tobacco Use  . Smoking status: Former Smoker    Packs/day: 1.00    Years: 10.00    Pack years: 10.00    Last attempt to quit: 01/23/2004    Years since quitting: 14.7  . Smokeless tobacco: Never Used  . Tobacco comment: quit appox 12 yrs ago  Substance Use Topics  . Alcohol use: Yes    Alcohol/week: 12.0 standard drinks    Types: 12 Cans of beer per week  . Drug use: No     Allergies   Patient has no known allergies.   Review of Systems Review of Systems  Constitutional: Positive for chills and fatigue. Negative for fever.  HENT: Negative for congestion.   Respiratory: Negative for  cough, chest tightness and shortness of breath.   Cardiovascular: Positive for leg swelling. Negative for chest pain and palpitations.  Gastrointestinal: Negative for abdominal pain.  Musculoskeletal: Negative for back pain and neck pain.  Skin: Positive for wound.  Neurological: Negative for facial asymmetry and weakness.  All other systems reviewed and are negative.    Physical Exam Updated Vital Signs BP (!) 155/90 (BP Location: Right Arm)   Pulse 78   Temp 98.6 F (37 C) (Oral)   Resp 20   Ht 6' (1.829 m)   Wt (!) 143.3 kg   SpO2 100%   BMI 42.86 kg/m   Physical Exam Vitals signs and nursing note reviewed.  Constitutional:      General: He is not in acute distress.    Appearance: He is well-developed. He is not diaphoretic.  HENT:     Head: Normocephalic and atraumatic.  Eyes:     General: No scleral icterus.       Right eye: No discharge.        Left eye: No discharge.     Conjunctiva/sclera: Conjunctivae normal.  Neck:     Musculoskeletal: Normal range of motion.  Cardiovascular:     Rate and Rhythm: Normal rate and regular rhythm.     Pulses: Normal pulses.     Comments: 2+ DP/PT pulses bilaterally. Pulmonary:     Effort: Pulmonary effort is normal. No respiratory distress.     Breath sounds: No stridor.  Abdominal:     General: There is no distension.  Musculoskeletal:        General: No deformity.     Comments: Right lower extremity is swollen from the knee down, primarily over the bilateral ankle.  Please see clinical image.  There is tenderness to palpation primarily localized over the ankle with generalized firmness throughout the calf.  No fluctuance.  Skin:    General: Skin is warm and dry.     Comments: Please see clinical image.  There are multiple open wounds on the right lower extremity.  There is erythema, primarily over the ankle, which patient states is not from his psoriasis.  Neurological:     Mental Status: He is alert.     Motor: No  abnormal muscle tone.     Comments: Sensation intact to bilateral feet.  Psychiatric:        Behavior: Behavior normal.          ED Treatments / Results  Labs (all labs ordered are listed, but only abnormal results are displayed) Labs Reviewed  BASIC METABOLIC PANEL - Abnormal; Notable for the following components:      Result  Value   Sodium 131 (*)    Potassium 3.4 (*)    Glucose, Bld 119 (*)    Calcium 8.7 (*)    All other components within normal limits  CBC WITH DIFFERENTIAL/PLATELET - Abnormal; Notable for the following components:   Neutro Abs 8.3 (*)    All other components within normal limits    EKG None  Radiology US Venous Img Lower Right (dvt Study)  Result Date: 10/18/2018 CLINICAL DATA:  Right lower extremity redness, swelling EXAM: RIGHT LOWER EXTREMITY VENOUS DOPPLER ULTRASOUND TECHNIQUE: Gray-scale sonography with graded compression, as well as color Doppler and duplex ultrasound were performed to evaluate the lower extremity deep venous systems from the level of the common femoral vein and including the common femoral, femoral, profunda femoral, popliteal and calf veins including the posterior tibial, peroneal and gastrocnemius veins when visible. The superficial great saphenous vein was also interrogated. Spectral Doppler was utilized to evaluate flow at rest and with distal augmentation maneuvers in the common femoral, femoral and popliteal veins. COMPARISON:  None. FINDINGS: Contralateral Common Femoral Vein: Respiratory phasicity is normal and symmetric with the symptomatic side. No evidence of thrombus. Normal compressibility. Common Femoral Vein: No evidence of thrombus. Normal compressibility, respiratory phasicity and response to augmentation. Saphenofemoral Junction: No evidence of thrombus. Normal compressibility and flow on color Doppler imaging. Profunda Femoral Vein: No evidence of thrombus. Normal compressibility and flow on color Doppler imaging.  Femoral Vein: No evidence of thrombus. Normal compressibility, respiratory phasicity and response to augmentation. Popliteal Vein: No evidence of thrombus. Normal compressibility, respiratory phasicity and response to augmentation. Calf Veins: No evidence of thrombus. Normal compressibility and flow on color Doppler imaging. Superficial Great Saphenous Vein: Nonocclusive thrombus noted within the greater saphenous vein from the upper thigh to the knee. The thrombus begins below the saphenofemoral junction. Venous Reflux:  None. Other Findings:  Mildly prominent lymph nodes in the right groin. IMPRESSION: Superficial thrombophlebitis with nonocclusive thrombus in the right thigh greater saphenous vein. No evidence of deep venous thrombosis. Mildly prominent lymph nodes in the right groin. Electronically Signed   By: Rolm Baptise M.D.   On: 10/18/2018 20:04    Procedures Procedures (including critical care time)  Medications Ordered in ED Medications  clindamycin (CLEOCIN) IVPB 600 mg ( Intravenous Stopped 10/18/18 2133)  Rivaroxaban (XARELTO) tablet 15 mg (15 mg Oral Given 10/18/18 2105)  rivaroxaban [facility]) Education Kit for DVT/PE patients ( Does not apply Given 10/18/18 2107)     Initial Impression / Assessment and Plan / ED Course  I have reviewed the triage vital signs and the nursing notes.  Pertinent labs & imaging results that were available during my care of the patient were reviewed by me and considered in my medical decision making (see chart for details).  Clinical Course as of Oct 19 2343  Wed Oct 18, 2018  2156 Patient reevaluated, he is dressed and states he is ready to go home.  Did discuss with patient possible admission however he declines that at this point.   [EH]    Clinical Course User Index [EH] Lorin Glass, PA-C      Patient presents today for evaluation of right leg pain.  Clinically he has significant edema with concern for DVT given edema.  Ultrasound  was obtained showing a right leg DVT.  He denies any history of intracranial hemorrhage or significant recent surgeries or GI bleeding, therefore will anticoagulate with Xarelto.  He is given first dose while in the  emergency room.  He is given Xarelto starter kit with information in addition to first 30 days of Xarelto.  He does have multiple open wounds, and given extent of edema there is slight concern for cellulitis.  He has recently been sick with the flu so difficult to fully gauge if his symptoms are related to cellulitis or influenza.  Labs are obtained, his white count is 10.2 which is not elevated however neutrophils are slightly elevated at 8.3.  Discussed lab results with patient.  Patient is given a dose of IV clindamycin while in the emergency room and prescription for continued clindamycin at home.  I did discuss with patient role of admission given uncertainty of the infection and the fact that his symptoms may be caused by infection however patient is adamant about going home even after we discussed the possible risks of this.  He was informed that his blood pressure is elevated and that he needs to seek appropriate follow-up.    Return precautions were discussed with patient who states their understanding.  At the time of discharge patient denied any unaddressed complaints or concerns.  Patient is agreeable for discharge home.  This patient was seen as a shared visit with Dr. Stark Jock.   Final Clinical Impressions(s) / ED Diagnoses   Final diagnoses:  Right leg pain  Acute deep vein thrombosis (DVT) of right lower extremity, unspecified vein (HCC)  Cellulitis of right lower extremity    ED Discharge Orders         Ordered    Rivaroxaban 15 & 20 MG TBPK  Status:  Discontinued     10/18/18 2159    clindamycin (CLEOCIN) 150 MG capsule  3 times daily,   Status:  Discontinued     10/18/18 2159    clindamycin (CLEOCIN) 150 MG capsule  3 times daily     10/18/18 2210    Rivaroxaban 15  & 20 MG TBPK     10/18/18 2210           Lorin Glass, PA-C 10/18/18 2348    Veryl Speak, MD 10/21/18 [phone]

## 2018-10-18 NOTE — Discharge Instructions (Addendum)
Go immediately to [facility]  [address] for further evaluation of leg swelling and skin infection.

## 2018-10-18 NOTE — Discharge Instructions (Addendum)
Please take Tylenol (acetaminophen) to relieve your pain.  You may take tylenol, up to 1,000 mg (two extra strength pills).  Do not take more than 3,000 mg tylenol in a 24 hour period.  Please check all medication labels as many medications such as pain and cold medications may contain tylenol. Please do not drink alcohol while taking this medication.  ° °You may have diarrhea from the antibiotics.  It is very important that you continue to take the antibiotics even if you get diarrhea unless a medical professional tells you that you may stop taking them.  If you stop too early the bacteria you are being treated for will become stronger and you may need different, more powerful antibiotics that have more side effects and worsening diarrhea.  Please stay well hydrated and consider probiotics as they may decrease the severity of your diarrhea.   ° °

## 2018-10-18 NOTE — ED Notes (Signed)
Pt at US at this time

## 2018-10-18 NOTE — ED Provider Notes (Signed)
[facility]    CSN: [id number] Arrival date & time: 10/18/18  1729   History   Chief Complaint Chief Complaint  Patient presents with  . Fever    HPI [name] is a 43 y.o. male.   HPI  Patient presents today with a complaint of left leg swelling with possible infection. Patient has chronic psoriasis and reports he was diagnosed with the flu on Monday which he feels like he recovered from flu. He complains of three days of gradually worsening lower left leg swelling and redness. He has chronic superficial DVT of the upper left leg and varicose veins. He has been immobile for last 3-4 days due to influenza. Pain is most pronounced around the lower ankle. He is unable to bear weight on left leg. He is febrile and BP slightly elevated.  Past Medical History:  Diagnosis Date  . HTN (hypertension)   . Nonischemic cardiomyopathy (H[facility])    EF improved; ? 2/2 ETOH vs. tachy mediated;  Echo 10/2007: EF 55-60%; mild to mod LAE,; trivial MR;    Cath 09/2006: normal cors  . Paroxysmal A-fib [facility])     Patient Active Problem List   Diagnosis Date Noted  . Obesity due to excess calories 04/11/2018  . History of gout 04/11/2018  . Superficial thrombophlebitis 01/23/2015  . Edema 11/06/2010  . Alcohol use 10/16/2010  . Essential hypertension 06/30/2008  . CARDIOMYOPATHY, PRIMARY, DILATED 06/30/2008  . PAF (paroxysmal atrial fibrillation) (HCC) 06/30/2008    Past Surgical History:  Procedure Laterality Date  . ANTERIOR CRUCIATE LIGAMENT REPAIR    . CARDIAC CATHETERIZATION  10/05/06       Home Medications    Prior to Admission medications   Medication Sig Start Date End Date Taking? Authorizing Provider  furosemide (LASIX) 20 MG tablet TAKE 1 TABLET BY MOUTH ONCE DAILY 04/11/18   Corine Shelter K, PA-C  lisinopril (PRINIVIL,ZESTRIL) 20 MG tablet TAKE ONE TABLET BY MOUTH ONCE DAILY. 04/11/18   Abelino Derrick, PA-C  metoprolol succinate (TOPROL-XL) 100 MG 24 hr tablet Take  1 tablet (100 mg total) by mouth 2 (two) times daily. Take with or immediately following a meal. 04/11/18   Kilroy, Eda Paschal, PA-C  potassium chloride (K-DUR) 10 MEQ tablet Take 1 tablet (10 mEq total) by mouth daily. 04/11/18   Abelino Derrick, PA-C    Family History Family History  Problem Relation Age of Onset  . Hypertension Father   . Alcohol abuse Father   . Alcohol abuse Mother     Social History Social History   Tobacco Use  . Smoking status: Former Smoker    Packs/day: 1.00    Years: 10.00    Pack years: 10.00    Last attempt to quit: 01/23/2004    Years since quitting: 14.7  . Smokeless tobacco: Never Used  . Tobacco comment: quit appox 12 yrs ago  Substance Use Topics  . Alcohol use: Yes    Alcohol/week: 12.0 standard drinks    Types: 12 Cans of beer per week  . Drug use: No     Allergies   Patient has no known allergies.   Review of Systems Review of Systems Pertinent negatives listed in HPI Physical Exam Triage Vital Signs ED Triage Vitals  Enc Vitals Group     BP 10/18/18 1737 (!) 147/81     Pulse Rate 10/18/18 1737 (!) 110     Resp 10/18/18 1737 18     Temp 10/18/18 1737 100.2  F (37.9 C)     Temp Source 10/18/18 1737 Oral     SpO2 10/18/18 1737 96 %     Weight --      Height --      Head Circumference --      Peak Flow --      Pain Score 10/18/18 1738 3     Pain Loc --      Pain Edu? --      Excl. in GC? --    No data found.  Updated Vital Signs BP (!) 147/81 (BP Location: Left Arm)   Pulse (!) 110   Temp 100.2 F (37.9 C) (Oral)   Resp 18   SpO2 96%   Visual Acuity Right Eye Distance:   Left Eye Distance:   Bilateral Distance:    Right Eye Near:   Left Eye Near:    Bilateral Near:     Physical Exam General appearance: appears ill and diaphoretic  Head: Normocephalic, without obvious abnormality, atraumatic Respiratory: Respirations even and unlabored, normal respiratory rate Extremities: See photo-left dorsalis pedal pulse +2  palpable. Tenderness noted left lower ankle. Skin: Increased warmth lower left leg, edema and erythema.  Psych: Appropriate mood and affect. Neurologic: Mental status: Alert, oriented to person, place, and time, thought content appropriate.        UC Treatments / Results  Labs (all labs ordered are listed, but only abnormal results are displayed) Labs Reviewed - No data to display  EKG None  Radiology No results found.  Procedures Procedures (including critical care time)  Medications Ordered in UC Medications - No data to display  Initial Impression / Assessment and Plan / UC Course  I have reviewed the triage vital signs and the nursing notes.  Pertinent labs & imaging results that were available during my care of the patient were reviewed by me and considered in my medical decision making (see chart for details).    Patient is febrile and appears slightly distressed. Left leg appears to be infected with cellulitis infection, however of most concern, is the lower leg ankle/foot swelling and concern for possible acute DVT. Given recent immobility, I am deferring patient to the ER at [facility] for further evaluation and imaging.  Final Clinical Impressions(s) / UC Diagnoses   Final diagnoses:  Cellulitis of leg, left  Left leg swelling  Chronic deep vein thrombosis (DVT) of left upper extremity, unspecified vein [facility])   Discharge Instructions   None    ED Prescriptions    None     Controlled Substance Prescriptions  Controlled Substance Registry consulted? Not Applicable   Bing Neighbors, FNP 10/18/18 [phone]

## 2018-10-18 NOTE — ED Triage Notes (Signed)
Pt states dx'd with flu on Monday, now c/o fever coming back and swelling, pain and redness to rt ankle.

## 2018-10-18 NOTE — ED Notes (Signed)
Instructions on xarelto starter diary given.

## 2018-10-18 NOTE — ED Triage Notes (Addendum)
Pt c/o pain, redness to right LE-sent from UC with dx cellulitis vs DVT-pt was dx with flu 2 days ago and is taking tamilfu-took tylenol x 2 at 5pm-NAD-steady gait with crutch x 1

## 2018-10-19 MED FILL — XARELTO STARTER PACK: 15 & 20 | 30 days supply | Qty: 51 | Fill #0

## 2018-10-19 MED FILL — CLINDAMYCIN HCL 150 MG CAPS: 150 | 7 days supply | Qty: 63 | Fill #0

## 2018-10-23 DIAGNOSIS — I82401 Acute embolism and thrombosis of unspecified deep veins of right lower extremity: Secondary | ICD-10-CM | POA: Diagnosis not present

## 2018-10-23 DIAGNOSIS — L03115 Cellulitis of right lower limb: Secondary | ICD-10-CM | POA: Diagnosis not present

## 2018-10-23 DIAGNOSIS — M25571 Pain in right ankle and joints of right foot: Secondary | ICD-10-CM | POA: Diagnosis not present

## 2018-10-26 DIAGNOSIS — I809 Phlebitis and thrombophlebitis of unspecified site: Secondary | ICD-10-CM | POA: Diagnosis not present

## 2018-10-26 DIAGNOSIS — M109 Gout, unspecified: Secondary | ICD-10-CM | POA: Diagnosis not present

## 2018-10-26 DIAGNOSIS — L03115 Cellulitis of right lower limb: Secondary | ICD-10-CM | POA: Diagnosis not present

## 2018-10-26 DIAGNOSIS — Z86718 Personal history of other venous thrombosis and embolism: Secondary | ICD-10-CM | POA: Diagnosis not present

## 2018-10-26 DIAGNOSIS — M25571 Pain in right ankle and joints of right foot: Secondary | ICD-10-CM | POA: Diagnosis not present

## 2019-01-22 ENCOUNTER — Other Ambulatory Visit: Payer: Self-pay

## 2019-01-22 MED ORDER — METOPROLOL SUCCINATE ER 100 MG PO TB24: 100.0000 mg | ORAL_TABLET | Freq: Two times a day (BID) | ORAL | 3 refills | Status: DC

## 2019-03-26 ENCOUNTER — Other Ambulatory Visit: Payer: Self-pay | Admitting: Cardiology

## 2019-05-24 DIAGNOSIS — L03115 Cellulitis of right lower limb: Secondary | ICD-10-CM | POA: Diagnosis not present

## 2019-05-29 ENCOUNTER — Other Ambulatory Visit: Payer: Self-pay | Admitting: Cardiology

## 2019-05-30 NOTE — Telephone Encounter (Signed)
Refill [name]  

## 2019-05-30 NOTE — Telephone Encounter (Signed)
Pt has not been seen here in a long time. Has appt with you on 10/12. Pt was seen in ED recently.  Routing to Dr. Stanford Breed and Hilda Blades, RN to advise on day of appt.

## 2019-05-30 NOTE — Progress Notes (Signed)
Virtual Visit via Video Note changed to phone visit at pt request   This visit type was conducted due to national recommendations for restrictions regarding the COVID-19 Pandemic (e.g. social distancing) in an effort to limit this patient's exposure and mitigate transmission in our community.  Due to his co-morbid illnesses, this patient is at least at moderate risk for complications without adequate follow up.  This format is felt to be most appropriate for this patient at this time.  All issues noted in this document were discussed and addressed.  A limited physical exam was performed with this format.  Please refer to the patient's chart for his consent to telehealth for [facility].   Date:  06/04/2019   ID:  [name], DOB [birth year], MRN [id number]  Patient Location:Home Provider Location: Home  PCP:  Marden Noble, MD  Cardiologist:  Dr Jens Som  Evaluation Performed:  Follow-Up Visit  Chief Complaint:  FU cardiomyopathy  History of Present Illness:    FU AFib and NICM. NICM thought to be due to alcohol or tachy mediated previously. Cath in 2008 demonstrated normal coronary arteries. Previously discontinued pradaxa on his own and declined to resume. Also with h/o ETOH. Echo 5/15 showed LV function had improved with EF 60-65, mild biatrial enlargement. Since last seen, no dyspnea, chest pain, palpitations or syncope.  The patient does not have symptoms concerning for COVID-19 infection (fever, chills, cough, or new shortness of breath).    Past Medical History:  Diagnosis Date  . HTN (hypertension)   . Nonischemic cardiomyopathy (HCC)    EF improved; ? 2/2 ETOH vs. tachy mediated;  Echo 10/2007: EF 55-60%; mild to mod LAE,; trivial MR;    Cath 09/2006: normal cors  . Paroxysmal A-fib [facility])    Past Surgical History:  Procedure Laterality Date  . ANTERIOR CRUCIATE LIGAMENT REPAIR    . CARDIAC CATHETERIZATION  10/05/06     Current Meds  Medication Sig  .  allopurinol (ZYLOPRIM) 100 MG tablet Take 100 mg by mouth daily.  Marland Kitchen aspirin 325 MG tablet Take 325 mg by mouth daily.  . furosemide (LASIX) 20 MG tablet Take 1 tablet by mouth once daily  . lisinopril (ZESTRIL) 20 MG tablet TAKE 1 TABLET BY MOUTH ONCE DAILY(PLEASE SCHEDULE AN APPT FOR FUTURE REFILLS)  . metoprolol succinate (TOPROL-XL) 100 MG 24 hr tablet Take 1 tablet (100 mg total) by mouth 2 (two) times daily. Take with or immediately following a meal.  . potassium chloride (KLOR-CON) 10 MEQ tablet Take 1 tablet by mouth once daily     Allergies:   Patient has no known allergies.   Social History   Tobacco Use  . Smoking status: Former Smoker    Packs/day: 1.00    Years: 10.00    Pack years: 10.00    Quit date: 01/23/2004    Years since quitting: 15.3  . Smokeless tobacco: Never Used  . Tobacco comment: quit appox 12 yrs ago  Substance Use Topics  . Alcohol use: Yes    Alcohol/week: 12.0 standard drinks    Types: 12 Cans of beer per week  . Drug use: No     Family Hx: The patient's family history includes Alcohol abuse in his father and mother; Hypertension in his father.  ROS:   Please see the history of present illness.    No Fever, chills  or productive cough All other systems reviewed and are negative.  Recent Labs: 10/18/2018: BUN 14; Creatinine, Ser 1.00; Hemoglobin  14.9; Platelets 218; Potassium 3.4; Sodium 131    Wt Readings from Last 3 Encounters:  06/04/19 (!) 320 lb (145.2 kg)  10/18/18 (!) 316 lb (143.3 kg)  04/11/18 (!) 309 lb 12.8 oz (140.5 kg)     Objective:    Vital Signs:  BP 125/82 Comment: yesterday  Pulse 62   Ht 6' (1.829 m)   Wt (!) 320 lb (145.2 kg)   BMI 43.40 kg/m    VITAL SIGNS:  reviewed NAD Answers questions appropriately Normal affect Remainder of physical examination not performed (telehealth visit; coronavirus pandemic)  ASSESSMENT & PLAN:    1. History of dilated cardiomyopathy-previously felt to be tachycardia mediated  versus alcohol induced.  Continue ACE inhibitor and beta-blocker.  Repeat echocardiogram. 2. Hypertension-blood pressure controlled.  Continue present medications. 3. Paroxysmal atrial fibrillation-by history patient remains in sinus rhythm.  Continue beta-blocker and aspirin.  Embolic risk factors include history of hypertension and prior dilated cardiomyopathy.  I have not treated with anticoagulation previously because there have been issues with compliance and alcohol.  He also previously declined resuming Pradaxa. 4. Alcohol abuse-I discussed the importance of avoiding.  COVID-19 Education: The importance of social distancing was discussed today.  Time:   Today, I have spent 17 minutes with the patient with telehealth technology discussing the above problems.     Medication Adjustments/Labs and Tests Ordered: Current medicines are reviewed at length with the patient today.  Concerns regarding medicines are outlined above.   Tests Ordered: No orders of the defined types were placed in this encounter.   Medication Changes: No orders of the defined types were placed in this encounter.   Follow Up:  Either In Person or Virtual Visit in 1 year(s)  Signed, Kirk Ruths, MD  06/04/2019 9:07 AM    [place]

## 2019-06-04 ENCOUNTER — Telehealth (INDEPENDENT_AMBULATORY_CARE_PROVIDER_SITE_OTHER): Payer: 59 | Admitting: Cardiology

## 2019-06-04 ENCOUNTER — Other Ambulatory Visit: Payer: Self-pay

## 2019-06-04 ENCOUNTER — Encounter: Payer: Self-pay | Admitting: Cardiology

## 2019-06-04 VITALS — BP 125/82 | HR 62 | Ht 72.0 in | Wt 320.0 lb

## 2019-06-04 DIAGNOSIS — I10 Essential (primary) hypertension: Secondary | ICD-10-CM

## 2019-06-04 DIAGNOSIS — I428 Other cardiomyopathies: Secondary | ICD-10-CM

## 2019-06-04 DIAGNOSIS — I480 Paroxysmal atrial fibrillation: Secondary | ICD-10-CM

## 2019-06-04 NOTE — Patient Instructions (Signed)
Medication Instructions:  The current medical regimen is effective;  continue present plan and medications.  If you need a refill on your cardiac medications before your next appointment, please call your pharmacy.   Lab work: BMET If you have labs (blood work) drawn today and your tests are completely normal, you will receive your results only by: Marland Kitchen MyChart Message (if you have MyChart) OR . A paper copy in the mail If you have any lab test that is abnormal or we need to change your treatment, we will call you to review the results.  Testing/Procedures: Echocardiogram - Your physician has requested that you have an echocardiogram. Echocardiography is a painless test that uses sound waves to create images of your heart. It provides your doctor with information about the size and shape of your heart and how well your heart's chambers and valves are working. This procedure takes approximately one hour. There are no restrictions for this procedure. This will be performed at our [facility] location - [address], [address].   Follow-Up: At [facility], you and your health needs are our priority.  As part of our continuing mission to provide you with exceptional heart care, we have created designated Provider Care Teams.  These Care Teams include your primary Cardiologist (physician) and Advanced Practice Providers (APPs -  Physician Assistants and Nurse Practitioners) who all work together to provide you with the care you need, when you need it. You will need a follow up appointment in 12 months.  Please call our office 2 months in advance to schedule this appointment.  You may see Kirk Ruths, MD or one of the following Advanced Practice Providers on your designated Care Team:   Kerin Ransom, PA-C Roby Lofts, [place] . Sande Rives, PA-C

## 2019-06-15 ENCOUNTER — Other Ambulatory Visit: Payer: Self-pay

## 2019-06-15 ENCOUNTER — Ambulatory Visit (HOSPITAL_COMMUNITY): Payer: BC Managed Care – PPO | Attending: Cardiology

## 2019-06-15 ENCOUNTER — Other Ambulatory Visit: Payer: BC Managed Care – PPO | Admitting: *Deleted

## 2019-06-15 DIAGNOSIS — I480 Paroxysmal atrial fibrillation: Secondary | ICD-10-CM | POA: Diagnosis not present

## 2019-06-15 DIAGNOSIS — I10 Essential (primary) hypertension: Secondary | ICD-10-CM | POA: Insufficient documentation

## 2019-06-15 DIAGNOSIS — I428 Other cardiomyopathies: Secondary | ICD-10-CM | POA: Insufficient documentation

## 2019-06-15 MED ORDER — PERFLUTREN LIPID MICROSPHERE
1.0000 mL | INTRAVENOUS | Status: AC | PRN
  Administered 2019-06-15: 3 mL via INTRAVENOUS

## 2019-06-16 LAB — BASIC METABOLIC PANEL
BUN/Creatinine Ratio: 12 (ref 9–20)
BUN: 9 mg/dL (ref 6–24)
CO2: 24 mmol/L (ref 20–29)
Calcium: 8.9 mg/dL (ref 8.7–10.2)
Chloride: 101 mmol/L (ref 96–106)
Creatinine, Ser: 0.74 mg/dL — ABNORMAL LOW (ref 0.76–1.27)
GFR calc Af Amer: 130 mL/min/{1.73_m2} (ref >59)
GFR calc non Af Amer: 112 mL/min/{1.73_m2} (ref >59)
Glucose: 88 mg/dL (ref 65–99)
Potassium: 4.4 mmol/L (ref 3.5–5.2)
Sodium: 138 mmol/L (ref 134–144)

## 2019-06-21 ENCOUNTER — Other Ambulatory Visit: Payer: Self-pay | Admitting: Cardiology

## 2019-06-28 ENCOUNTER — Other Ambulatory Visit: Payer: Self-pay | Admitting: Cardiology

## 2019-06-28 MED ORDER — FUROSEMIDE 20 MG PO TABS: 20.0000 mg | ORAL_TABLET | Freq: Every day | ORAL | 1 refills | Status: DC

## 2019-06-28 MED ORDER — LISINOPRIL 20 MG PO TABS: ORAL_TABLET | ORAL | 1 refills | Status: DC

## 2019-06-28 MED ORDER — METOPROLOL SUCCINATE ER 100 MG PO TB24: 100.0000 mg | ORAL_TABLET | Freq: Two times a day (BID) | ORAL | 1 refills | Status: DC

## 2019-06-28 MED ORDER — POTASSIUM CHLORIDE ER 10 MEQ PO TBCR: 10.0000 meq | EXTENDED_RELEASE_TABLET | Freq: Every day | ORAL | 1 refills | Status: DC

## 2019-06-28 NOTE — Telephone Encounter (Signed)
This is Dr. Crenshaw's pt 

## 2019-09-03 DIAGNOSIS — R0602 Shortness of breath: Secondary | ICD-10-CM | POA: Diagnosis not present

## 2019-09-03 DIAGNOSIS — R05 Cough: Secondary | ICD-10-CM | POA: Diagnosis not present

## 2019-09-04 DIAGNOSIS — Z20828 Contact with and (suspected) exposure to other viral communicable diseases: Secondary | ICD-10-CM | POA: Diagnosis not present

## 2019-11-23 ENCOUNTER — Ambulatory Visit: Payer: BC Managed Care – PPO | Attending: Internal Medicine

## 2019-11-23 DIAGNOSIS — Z23 Encounter for immunization: Secondary | ICD-10-CM

## 2019-11-23 NOTE — Progress Notes (Signed)
   Covid-19 Vaccination Clinic  Name:  [name]    MRN: [id number] DOB: 11-24-1975  11/23/2019  [name] was observed post Covid-19 immunization for 15 minutes without incident. He was provided with Vaccine Information Sheet and instruction to access the V-Safe system.   [name] was instructed to call 911 with any severe reactions post vaccine: Marland Kitchen Difficulty breathing  . Swelling of face and throat  . A fast heartbeat  . A bad rash all over body  . Dizziness and weakness   Immunizations Administered    Name Date Dose VIS Date Route   Pfizer COVID-19 Vaccine 11/23/2019 10:50 AM 0.3 mL 08/03/2019 Intramuscular   Manufacturer: [employer], [employer]   Lot: [id number]   NDC: [address]-6

## 2019-12-03 DIAGNOSIS — U071 COVID-19: Secondary | ICD-10-CM | POA: Diagnosis not present

## 2019-12-07 DIAGNOSIS — Z03818 Encounter for observation for suspected exposure to other biological agents ruled out: Secondary | ICD-10-CM | POA: Diagnosis not present

## 2019-12-07 DIAGNOSIS — Z20828 Contact with and (suspected) exposure to other viral communicable diseases: Secondary | ICD-10-CM | POA: Diagnosis not present

## 2019-12-19 ENCOUNTER — Ambulatory Visit: Payer: BC Managed Care – PPO | Attending: Internal Medicine

## 2019-12-19 DIAGNOSIS — Z23 Encounter for immunization: Secondary | ICD-10-CM

## 2019-12-19 NOTE — Progress Notes (Signed)
   Covid-19 Vaccination Clinic  Name:  MAYCOL HOYING    MRN: [id number] DOB: 06/24/1976  12/19/2019  Mr. Linsey was observed post Covid-19 immunization for 15 minutes without incident. He was provided with Vaccine Information Sheet and instruction to access the V-Safe system.   Mr. Villanueva was instructed to call 911 with any severe reactions post vaccine: Marland Kitchen Difficulty breathing  . Swelling of face and throat  . A fast heartbeat  . A bad rash all over body  . Dizziness and weakness   Immunizations Administered    Name Date Dose VIS Date Route   Pfizer COVID-19 Vaccine 12/19/2019 12:22 PM 0.3 mL 10/17/2018 Intramuscular   Manufacturer: [employer], [employer]   Lot: [id number]   NDC: [address]-3

## 2020-01-27 ENCOUNTER — Other Ambulatory Visit: Payer: Self-pay | Admitting: Cardiology

## 2020-02-01 ENCOUNTER — Emergency Department (HOSPITAL_COMMUNITY)
Admission: EM | Admit: 2020-02-01 | Discharge: 2020-02-21 | Disposition: E | Payer: BC Managed Care – PPO | Attending: Emergency Medicine | Admitting: Emergency Medicine

## 2020-02-01 DIAGNOSIS — I469 Cardiac arrest, cause unspecified: Secondary | ICD-10-CM

## 2020-02-01 MED FILL — Medication: Qty: 1 | Status: AC

## 2020-02-21 NOTE — Progress Notes (Signed)
Responded to ED page to support family.  Patient deceased.  Patient was found by Mother prone down and brought to ED.  Family presence.. Family escorted to bedside to visit with patient.  Provided emotional support and presence. Support staff.  Patient is staff neighbor.  Chaplain will follow as needed.  Venida Jarvis, [place], [facility], Pager [phone]

## 2020-02-21 NOTE — Code Documentation (Signed)
Patient time of death occurred at 11:34

## 2020-02-21 NOTE — ED Triage Notes (Signed)
Pt arrives via EMS at 11:28, CPR in progress. Pt was last seen alive 9:22. Pt found prone in front yard at 10:22. Fire reports he was blue, AED shocked 3 times. 6 total epi given, 1 shock delivered en route due to fine V-fib. King Airway in place.

## 2020-02-21 NOTE — ED Provider Notes (Signed)
[facility] EMERGENCY DEPARTMENT Provider Note   CSN: [id number] Arrival date & time: 02/08/20  1138     History Chief Complaint  Patient presents with   cpr    [name] is a 44 y.o. male.  HPI   Patient presented to the ED for evaluation of cardiac arrest.  Patient has a history of paroxysmal A. fib, obesity, hypertension and nonischemic cardiomyopathy according to the medical records.  According to the EMS and family, patient did not have any issues today he had been at work and seemed fine.  He was in his usual state of health.  He was at his mother's house and she last saw him at 922 this morning.  He was outside.  At 1022 she found him lying on the ground unresponsive and appeared cyanotic.  EMS was called and the patient underwent ACLS treatment.  A King airway was laced.  He had manual CPR by EMS.  Patient was given 6 rounds of epi he was shocked 3 times by the AED and was also given 1 more shocked by EMS.  Patient had no response to any of those treatments.  Past Medical History:  Diagnosis Date   HTN (hypertension)    Nonischemic cardiomyopathy (HCC)    EF improved; ? 2/2 ETOH vs. tachy mediated;  Echo 10/2007: EF 55-60%; mild to mod LAE,; trivial Shane;    Cath 09/2006: normal cors   Paroxysmal A-fib [facility])     Patient Active Problem List   Diagnosis Date Noted   Obesity due to excess calories 04/11/2018   History of gout 04/11/2018   Superficial thrombophlebitis 01/23/2015   Edema 11/06/2010   Alcohol use 10/16/2010   Essential hypertension 06/30/2008   Nonischemic cardiomyopathy (HCC) 06/30/2008   PAF (paroxysmal atrial fibrillation) (HCC) 06/30/2008    Past Surgical History:  Procedure Laterality Date   ANTERIOR CRUCIATE LIGAMENT REPAIR     CARDIAC CATHETERIZATION  10/05/06       Family History  Problem Relation Age of Onset   Hypertension Father    Alcohol abuse Father    Alcohol abuse Mother     Social History    Tobacco Use   Smoking status: Former Smoker    Packs/day: 1.00    Years: 10.00    Pack years: 10.00    Quit date: 01/23/2004    Years since quitting: 16.0   Smokeless tobacco: Never Used   Tobacco comment: quit appox 12 yrs ago  Substance Use Topics   Alcohol use: Yes    Alcohol/week: 12.0 standard drinks    Types: 12 Cans of beer per week   Drug use: No    Home Medications Prior to Admission medications   Medication Sig Start Date End Date Taking? Authorizing Provider  allopurinol (ZYLOPRIM) 100 MG tablet Take 100 mg by mouth daily.    [provider]  aspirin 325 MG tablet Take 325 mg by mouth daily.    [provider]  furosemide (LASIX) 20 MG tablet Take 1 tablet (20 mg total) by mouth daily. 01/30/20   Lewayne Bunting, MD  lisinopril (ZESTRIL) 20 MG tablet TAKE 1 TABLET BY MOUTH ONCE DAILY 01/30/20   Lewayne Bunting, MD  metoprolol succinate (TOPROL-XL) 100 MG 24 hr tablet Take 1 tablet (100 mg total) by mouth 2 (two) times daily with a meal. 01/30/20   Crenshaw, Madolyn Frieze, MD  potassium chloride (KLOR-CON) 10 MEQ tablet Take 1 tablet (10 mEq total) by mouth  daily. 01/30/20   Lelon Perla, MD  potassium chloride (KLOR-CON) 10 MEQ tablet Take 1 tablet (10 mEq total) by mouth daily. 06/28/19 01/30/20  Erlene Quan, PA-C    Allergies    Patient has no known allergies.  Review of Systems   Review of Systems  Unable to perform ROS: Acuity of condition    Physical Exam Updated Vital Signs Ht 1.829 m (6')    Wt (!) 146 kg    BMI 43.65 kg/m   Physical Exam Constitutional:      Appearance: He is obese.     Comments: Unresponsive  HENT:     Head: Normocephalic and atraumatic.  Eyes:     Comments: No icterus, no signs of trauma  Cardiovascular:     Comments: Absent heart sounds, no pulses absent heart sounds, no pulses Pulmonary:     Comments: King airway in place, brownish fluid noted in the tube Abdominal:     Comments: Distended abdomen   Musculoskeletal:        General: Swelling present. No deformity.  Skin:    Coloration: Skin is not jaundiced.     Comments: Cyanotic  Neurological:     Comments: GCS 3     ED Results / Procedures / Treatments   Labs (all labs ordered are listed, but only abnormal results are displayed) Labs Reviewed - No data to display  EKG None  Radiology No results found.  Procedures CPR  Date/Time: Feb 21, 2020 12:17 PM Performed by: Dorie Rank, MD Authorized by: Dorie Rank, MD  CPR Procedure Details:    ACLS/BLS initiated by EMS: Yes     CPR/ACLS performed in the ED: Yes     Duration of CPR (minutes):  75   Outcome: Pt declared dead    CPR performed via ACLS guidelines under my direct supervision.  See RN documentation for details including defibrillator use, medications, doses and timing. Comments:     Total CPR by EMS and by ED   (including critical care time)  Medications Ordered in ED Medications - No data to display  ED Course  I have reviewed the triage vital signs and the nursing notes.  Pertinent labs & imaging results that were available during my care of the patient were reviewed by me and considered in my medical decision making (see chart for details).    MDM Rules/Calculators/A&P                          Patient presented in cardiac arrest.  Patient had an unwitnessed arrest.  He had overall 1 hour of CPR and on arrival in the ED the patient remained pulseless and apneic.  He was in asystole on the monitor.  Brief bedside ultrasound performed and no cardiac activity noted.  Patient was last seen normal 2 hours prior.  He was treated with maximal ACLS efforts and unfortunate there has been no response.  Patient was declared dead at 44  I informed [name] family of his  unfortunate death. Case was discussed with the medical examiner.  He contacted the patient's primary care doctor, Dr. Inda Frederick who will sign off on the death certificate. Final Clinical  Impression(s) / ED Diagnoses Final diagnoses:  Cardiac arrest [facility])      Dorie Rank, MD 02/21/20 1219

## 2020-02-21 DEATH — deceased
